# Patient Record
Sex: Female | Born: 1990
Health system: Southern US, Community
[De-identification: ages and names within clinical notes are randomized; demographics above are authoritative.]

## PROBLEM LIST (undated history)

## (undated) ENCOUNTER — Inpatient Hospital Stay: Payer: Self-pay

## (undated) DIAGNOSIS — D649 Anemia, unspecified: Secondary | ICD-10-CM

## (undated) DIAGNOSIS — R519 Headache, unspecified: Secondary | ICD-10-CM

## (undated) HISTORY — PX: NO PAST SURGERIES: SHX2092

## (undated) HISTORY — DX: Anemia, unspecified: D64.9

## (undated) HISTORY — DX: Headache, unspecified: R51.9

---
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
## 2012-03-28 ENCOUNTER — Emergency Department: Payer: Self-pay | Admitting: Emergency Medicine

## 2013-03-30 ENCOUNTER — Emergency Department: Payer: Self-pay | Admitting: Emergency Medicine

## 2013-03-30 LAB — URINALYSIS, COMPLETE
Blood: NEGATIVE
Glucose,UR: NEGATIVE mg/dL (ref 0–75)
Leukocyte Esterase: NEGATIVE
Nitrite: NEGATIVE
Ph: 5 (ref 4.5–8.0)
RBC,UR: <1 /HPF (ref 0–5)
Specific Gravity: 1.029 (ref 1.003–1.030)
WBC UR: 1 /HPF (ref 0–5)

## 2013-03-30 LAB — CBC
HCT: 37.5 % (ref 35.0–47.0)
HGB: 12.3 g/dL (ref 12.0–16.0)
MCHC: 32.8 g/dL (ref 32.0–36.0)
Platelet: 173 10*3/uL (ref 150–440)
RBC: 4.1 10*6/uL (ref 3.80–5.20)
RDW: 12.6 % (ref 11.5–14.5)

## 2013-03-30 LAB — PREGNANCY, URINE: Pregnancy Test, Urine: NEGATIVE m[IU]/mL

## 2013-03-30 LAB — COMPREHENSIVE METABOLIC PANEL
Albumin: 4.2 g/dL (ref 3.4–5.0)
Anion Gap: 5 — ABNORMAL LOW (ref 7–16)
BUN: 17 mg/dL (ref 7–18)
Bilirubin,Total: 0.6 mg/dL (ref 0.2–1.0)
Co2: 28 mmol/L (ref 21–32)
EGFR (African American): >60
EGFR (Non-African Amer.): >60
Glucose: 91 mg/dL (ref 65–99)
Potassium: 4 mmol/L (ref 3.5–5.1)
SGOT(AST): 24 U/L (ref 15–37)
Sodium: 138 mmol/L (ref 136–145)
Total Protein: 8.3 g/dL — ABNORMAL HIGH (ref 6.4–8.2)

## 2013-03-30 LAB — LIPASE, BLOOD: Lipase: 138 U/L (ref 73–393)

## 2014-01-09 ENCOUNTER — Emergency Department: Payer: Self-pay | Admitting: Emergency Medicine

## 2015-12-24 ENCOUNTER — Emergency Department
Admission: EM | Admit: 2015-12-24 | Discharge: 2015-12-24 | Disposition: A | Discharge status: Home / Self Care | Payer: 59 | Attending: Emergency Medicine | Admitting: Emergency Medicine

## 2015-12-24 ENCOUNTER — Encounter: Payer: Self-pay | Admitting: *Deleted

## 2015-12-24 DIAGNOSIS — R197 Diarrhea, unspecified: Secondary | ICD-10-CM

## 2015-12-24 DIAGNOSIS — A084 Viral intestinal infection, unspecified: Secondary | ICD-10-CM

## 2015-12-24 LAB — CBC WITH DIFFERENTIAL/PLATELET
BASOS ABS: 0 10*3/uL (ref 0–0.1)
Basophils Relative: 0 %
EOS ABS: 0 10*3/uL (ref 0–0.7)
EOS PCT: 0 %
HCT: 36.4 % (ref 35.0–47.0)
Hemoglobin: 12.1 g/dL (ref 12.0–16.0)
Lymphocytes Relative: 14 %
Lymphs Abs: 1.1 10*3/uL (ref 1.0–3.6)
MCH: 31.1 pg (ref 26.0–34.0)
MCHC: 33.3 g/dL (ref 32.0–36.0)
MCV: 93.3 fL (ref 80.0–100.0)
MONO ABS: 0.8 10*3/uL (ref 0.2–0.9)
Monocytes Relative: 11 %
Neutro Abs: 5.7 10*3/uL (ref 1.4–6.5)
Neutrophils Relative %: 75 %
PLATELETS: 137 10*3/uL — AB (ref 150–440)
RBC: 3.9 MIL/uL (ref 3.80–5.20)
RDW: 12.4 % (ref 11.5–14.5)
WBC: 7.6 10*3/uL (ref 3.6–11.0)

## 2015-12-24 LAB — INFLUENZA PANEL BY PCR (TYPE A & B)
H1N1 flu by pcr: NOT DETECTED
INFLAPCR: NEGATIVE
Influenza B By PCR: NEGATIVE

## 2015-12-24 LAB — COMPREHENSIVE METABOLIC PANEL
ALBUMIN: 4 g/dL (ref 3.5–5.0)
ALK PHOS: 34 U/L — AB (ref 38–126)
ALT: 15 U/L (ref 14–54)
ANION GAP: 7 (ref 5–15)
AST: 22 U/L (ref 15–41)
BILIRUBIN TOTAL: 0.4 mg/dL (ref 0.3–1.2)
BUN: 11 mg/dL (ref 6–20)
CALCIUM: 9 mg/dL (ref 8.9–10.3)
CO2: 27 mmol/L (ref 22–32)
Chloride: 101 mmol/L (ref 101–111)
Creatinine, Ser: 0.88 mg/dL (ref 0.44–1.00)
GFR calc Af Amer: >60 mL/min (ref >60)
GLUCOSE: 83 mg/dL (ref 65–99)
Potassium: 3.8 mmol/L (ref 3.5–5.1)
SODIUM: 135 mmol/L (ref 135–145)
TOTAL PROTEIN: 8.1 g/dL (ref 6.5–8.1)

## 2015-12-24 MED ORDER — LOPERAMIDE HCL 2 MG PO TABS: 2.0000 mg | ORAL_TABLET | Freq: Four times a day (QID) | ORAL | Status: DC | PRN

## 2015-12-24 MED ORDER — ONDANSETRON 4 MG PO TBDP: 4.0000 mg | ORAL_TABLET | Freq: Three times a day (TID) | ORAL | Status: DC | PRN

## 2015-12-24 MED ORDER — SODIUM CHLORIDE 0.9 % IV BOLUS (SEPSIS)
1000.0000 mL | Freq: Once | INTRAVENOUS | Status: AC
  Administered 2015-12-24: 1000 mL via INTRAVENOUS

## 2015-12-24 MED ORDER — ACETAMINOPHEN 500 MG PO TABS
1000.0000 mg | ORAL_TABLET | Freq: Once | ORAL | Status: AC
  Administered 2015-12-24: 1000 mg via ORAL
  Filled 2015-12-24: qty 2

## 2015-12-24 MED ORDER — ONDANSETRON HCL 4 MG/2ML IJ SOLN
4.0000 mg | Freq: Once | INTRAMUSCULAR | Status: AC
  Administered 2015-12-24: 4 mg via INTRAVENOUS
  Filled 2015-12-24: qty 2

## 2015-12-24 NOTE — Discharge Instructions (Signed)
Diarrhea Diarrhea is frequent loose and watery bowel movements. It can cause you to feel weak and dehydrated. Dehydration can cause you to become tired and thirsty, have a dry mouth, and have decreased urination that often is dark yellow. Diarrhea is a sign of another problem, most often an infection that will not last long. In most cases, diarrhea typically lasts 2-3 days. However, it can last longer if it is a sign of something more serious. It is important to treat your diarrhea as directed by your caregiver to lessen or prevent future episodes of diarrhea. CAUSES  Some common causes include:  Gastrointestinal infections caused by viruses, bacteria, or parasites.  Food poisoning or food allergies.  Certain medicines, such as antibiotics, chemotherapy, and laxatives.  Artificial sweeteners and fructose.  Digestive disorders. HOME CARE INSTRUCTIONS  Ensure adequate fluid intake (hydration): Have 1 cup (8 oz) of fluid for each diarrhea episode. Avoid fluids that contain simple sugars or sports drinks, fruit juices, whole milk products, and sodas. Your urine should be clear or pale yellow if you are drinking enough fluids. Hydrate with an oral rehydration solution that you can purchase at pharmacies, retail stores, and online. You can prepare an oral rehydration solution at home by mixing the following ingredients together:   - tsp table salt.   tsp baking soda.   tsp salt substitute containing potassium chloride.  1  tablespoons sugar.  1 L (34 oz) of water.  Certain foods and beverages may increase the speed at which food moves through the gastrointestinal (GI) tract. These foods and beverages should be avoided and include:  Caffeinated and alcoholic beverages.  High-fiber foods, such as raw fruits and vegetables, nuts, seeds, and whole grain breads and cereals.  Foods and beverages sweetened with sugar alcohols, such as xylitol, sorbitol, and mannitol.  Some foods may be well  tolerated and may help thicken stool including:  Starchy foods, such as rice, toast, pasta, low-sugar cereal, oatmeal, grits, baked potatoes, crackers, and bagels.  Bananas.  Applesauce.  Add probiotic-rich foods to help increase healthy bacteria in the GI tract, such as yogurt and fermented milk products.  Wash your hands well after each diarrhea episode.  Only take over-the-counter or prescription medicines as directed by your caregiver.  Take a warm bath to relieve any burning or pain from frequent diarrhea episodes. SEEK IMMEDIATE MEDICAL CARE IF:   You are unable to keep fluids down.  You have persistent vomiting.  You have blood in your stool, or your stools are black and tarry.  You do not urinate in 6-8 hours, or there is only a small amount of very dark urine.  You have abdominal pain that increases or localizes.  You have weakness, dizziness, confusion, or light-headedness.  You have a severe headache.  Your diarrhea gets worse or does not get better.  You have a fever or persistent symptoms for more than 2-3 days.  You have a fever and your symptoms suddenly get worse. MAKE SURE YOU:   Understand these instructions.  Will watch your condition.  Will get help right away if you are not doing well or get worse.   This information is not intended to replace advice given to you by your health care provider. Make sure you discuss any questions you have with your health care provider.   Document Released: 12/03/2002 Document Revised: 01/03/2015 Document Reviewed: 08/20/2012 Elsevier Interactive Patient Education 2016 [employer].  Food Choices to Help Relieve Diarrhea, Adult When you have diarrhea,  the foods you eat and your eating habits are very important. Choosing the right foods and drinks can help relieve diarrhea. Also, because diarrhea can last up to 7 days, you need to replace lost fluids and electrolytes (such as sodium, potassium, and chloride) in  order to help prevent dehydration.  °WHAT GENERAL GUIDELINES DO I NEED TO FOLLOW? °· Slowly drink 1 cup (8 oz) of fluid for each episode of diarrhea. If you are getting enough fluid, your urine will be clear or pale yellow. °· Eat starchy foods. Some good choices include white rice, white toast, pasta, low-fiber cereal, baked potatoes (without the skin), saltine crackers, and bagels. °· Avoid large servings of any cooked vegetables. °· Limit fruit to two servings per day. A serving is ½ cup or 1 small piece. °· Choose foods with less than 2 g of fiber per serving. °· Limit fats to less than 8 tsp (38 g) per day. °· Avoid fried foods. °· Eat foods that have probiotics in them. Probiotics can be found in certain dairy products. °· Avoid foods and beverages that may increase the speed at which food moves through the stomach and intestines (gastrointestinal tract). Things to avoid include: °· High-fiber foods, such as dried fruit, raw fruits and vegetables, nuts, seeds, and whole grain foods. °· Spicy foods and high-fat foods. °· Foods and beverages sweetened with high-fructose corn syrup, honey, or sugar alcohols such as xylitol, sorbitol, and mannitol. °WHAT FOODS ARE RECOMMENDED? °Grains °White rice. White, French, or pita breads (fresh or toasted), including plain rolls, buns, or bagels. White pasta. Saltine, soda, or graham crackers. Pretzels. Low-fiber cereal. Cooked cereals made with water (such as cornmeal, farina, or cream cereals). Plain muffins. Matzo. Melba toast. Zwieback.  °Vegetables °Potatoes (without the skin). Strained tomato and vegetable juices. Most well-cooked and canned vegetables without seeds. Tender lettuce. °Fruits °Cooked or canned applesauce, apricots, cherries, fruit cocktail, grapefruit, peaches, pears, or plums. Fresh bananas, apples without skin, cherries, grapes, cantaloupe, grapefruit, peaches, oranges, or plums.  °Meat and Other Protein Products °Baked or boiled chicken. Eggs. Tofu.  Fish. Seafood. Smooth peanut butter. Ground or well-cooked tender beef, ham, veal, lamb, pork, or poultry.  °Dairy °Plain yogurt, kefir, and unsweetened liquid yogurt. Lactose-free milk, buttermilk, or soy milk. Plain hard cheese. °Beverages °Sport drinks. Clear broths. Diluted fruit juices (except prune). Regular, caffeine-free sodas such as ginger ale. Water. Decaffeinated teas. Oral rehydration solutions. Sugar-free beverages not sweetened with sugar alcohols. °Other °Bouillon, broth, or soups made from recommended foods.  °The items listed above may not be a complete list of recommended foods or beverages. Contact your dietitian for more options. °WHAT FOODS ARE NOT RECOMMENDED? °Grains °Whole grain, whole wheat, bran, or rye breads, rolls, pastas, crackers, and cereals. Wild or brown rice. Cereals that contain more than 2 g of fiber per serving. Corn tortillas or taco shells. Cooked or dry oatmeal. Granola. Popcorn. °Vegetables °Raw vegetables. Cabbage, broccoli, Brussels sprouts, artichokes, baked beans, beet greens, corn, kale, legumes, peas, sweet potatoes, and yams. Potato skins. Cooked spinach and cabbage. °Fruits °Dried fruit, including raisins and dates. Raw fruits. Stewed or dried prunes. Fresh apples with skin, apricots, mangoes, pears, raspberries, and strawberries.  °Meat and Other Protein Products °Chunky peanut butter. Nuts and seeds. Beans and lentils. Bacon.  °Dairy °High-fat cheeses. Milk, chocolate milk, and beverages made with milk, such as milk shakes. Cream. Ice cream. °Sweets and Desserts °Sweet rolls, doughnuts, and sweet breads. Pancakes and waffles. °Fats and Oils °Butter. Cream sauces. Margarine.   Salad oils. Plain salad dressings. Olives. Avocados.  Beverages Caffeinated beverages (such as coffee, tea, soda, or energy drinks). Alcoholic beverages. Fruit juices with pulp. Prune juice. Soft drinks sweetened with high-fructose corn syrup or sugar alcohols. Other Coconut. Hot sauce.  Chili powder. Mayonnaise. Gravy. Cream-based or milk-based soups.  The items listed above may not be a complete list of foods and beverages to avoid. Contact your dietitian for more information. WHAT SHOULD I DO IF I BECOME DEHYDRATED? Diarrhea can sometimes lead to dehydration. Signs of dehydration include dark urine and dry mouth and skin. If you think you are dehydrated, you should rehydrate with an oral rehydration solution. These solutions can be purchased at pharmacies, retail stores, or online.  Drink -1 cup (120-240 mL) of oral rehydration solution each time you have an episode of diarrhea. If drinking this amount makes your diarrhea worse, try drinking smaller amounts more often. For example, drink 1-3 tsp (5-15 mL) every 5-10 minutes.  A general rule for staying hydrated is to drink 1-2 L of fluid per day. Talk to your health care provider about the specific amount you should be drinking each day. Drink enough fluids to keep your urine clear or pale yellow.   This information is not intended to replace advice given to you by your health care provider. Make sure you discuss any questions you have with your health care provider.   Document Released: 03/04/2004 Document Revised: 01/03/2015 Document Reviewed: 11/05/2013 Elsevier Interactive Patient Education 2016 Elsevier Inc.  Probiotics WHAT ARE PROBIOTICS? Probiotics are the good bacteria and yeasts that live in your body and keep you and your digestive system healthy. Probiotics also help your body's defense (immune) system and protect your body against bad bacterial growth.  Certain foods contain probiotics, such as yogurt. Probiotics can also be purchased as a supplement. As with any supplement or drug, it is important to discuss its use with your health care provider.  WHAT AFFECTS THE BALANCE OF BACTERIA IN MY BODY? The balance of bacteria in your body can be affected by:   Antibiotic medicines. Antibiotics are sometimes necessary  to treat infection. Unfortunately, they may kill good or friendly bacteria in your body as well as the bad bacteria. This may lead to stomach problems like diarrhea, gas, and cramping.  Disease. Some conditions are the result of an overgrowth of bad bacteria, yeasts, parasites, or fungi. These conditions include:   Infectious diarrhea.  Stomach and respiratory infections.  Skin infections.  Irritable bowel syndrome (IBS).  Inflammatory bowel diseases.  Ulcer due to Helicobacter pylori (H. pylori) infection.  Tooth decay and periodontal disease.  Vaginal infections. Stress and poor diet may also lower the good bacteria in your body.  WHAT TYPE OF PROBIOTIC IS RIGHT FOR ME? Probiotics are available over the counter at your local pharmacy, health food, or grocery store. They come in many different forms, combinations of strains, and dosing strengths. Some may need to be refrigerated. Always read the label for storage and usage instructions. Specific strains have been shown to be more effective for certain conditions. Ask your health care provider what option is best for you.  WHY WOULD I NEED PROBIOTICS? There are many reasons your health care provider might recommend a probiotic supplement, including:   Diarrhea.  Constipation.  IBS.  Respiratory infections.  Yeast infections.  Acne, eczema, and other skin conditions.  Frequent urinary tract infections (UTIs). ARE THERE SIDE EFFECTS OF PROBIOTICS? Some people experience mild side effects when taking probiotics.  Side effects are usually temporary and may include:   Gas.  Bloating.  Cramping. Rarely, serious side effects, such as infection or immune system changes, may occur. WHAT ELSE DO I NEED TO KNOW ABOUT PROBIOTICS?   There are many different strains of probiotics. Certain strains may be more effective depending on your condition. Probiotics are available in varying doses. Ask your health care provider which  probiotic you should use and how often.   If you are taking probiotics along with antibiotics, it is generally recommended to wait at least 2 hours between taking the antibiotic and taking the probiotic.  FOR MORE INFORMATION:  [facility] http://potts.com/http://nccam.nih.gov/   This information is not intended to replace advice given to you by your health care provider. Make sure you discuss any questions you have with your health care provider.   Document Released: 07/10/2014 Document Reviewed: 07/10/2014 Elsevier Interactive Patient Education 2016 [employer]  Viral Gastroenteritis Viral gastroenteritis is also known as stomach flu. This condition affects the stomach and intestinal tract. It can cause sudden diarrhea and vomiting. The illness typically lasts 3 to 8 days. Most people develop an immune response that eventually gets rid of the virus. While this natural response develops, the virus can make you quite ill. CAUSES  Many different viruses can cause gastroenteritis, such as rotavirus or noroviruses. You can catch one of these viruses by consuming contaminated food or water. You may also catch a virus by sharing utensils or other personal items with an infected person or by touching a contaminated surface. SYMPTOMS  The most common symptoms are diarrhea and vomiting. These problems can cause a severe loss of body fluids (dehydration) and a body salt (electrolyte) imbalance. Other symptoms may include:  Fever.  Headache.  Fatigue.  Abdominal pain. DIAGNOSIS  Your caregiver can usually diagnose viral gastroenteritis based on your symptoms and a physical exam. A stool sample may also be taken to test for the presence of viruses or other infections. TREATMENT  This illness typically goes away on its own. Treatments are aimed at rehydration. The most serious cases of viral gastroenteritis involve vomiting so severely that you are not able to keep  fluids down. In these cases, fluids must be given through an intravenous line (IV). HOME CARE INSTRUCTIONS   Drink enough fluids to keep your urine clear or pale yellow. Drink small amounts of fluids frequently and increase the amounts as tolerated.  Ask your caregiver for specific rehydration instructions.  Avoid:  Foods high in sugar.  Alcohol.  Carbonated drinks.  Tobacco.  Juice.  Caffeine drinks.  Extremely hot or cold fluids.  Fatty, greasy foods.  Too much intake of anything at one time.  Dairy products until 24 to 48 hours after diarrhea stops.  You may consume probiotics. Probiotics are active cultures of beneficial bacteria. They may lessen the amount and number of diarrheal stools in adults. Probiotics can be found in yogurt with active cultures and in supplements.  Wash your hands well to avoid spreading the virus.  Only take over-the-counter or prescription medicines for pain, discomfort, or fever as directed by your caregiver. Do not give aspirin to children. Antidiarrheal medicines are not recommended.  Ask your caregiver if you should continue to take your regular prescribed and over-the-counter medicines.  Keep all follow-up appointments as directed by your caregiver. SEEK IMMEDIATE MEDICAL CARE IF:   You are unable to keep fluids down.  You do not urinate at least once  every 6 to 8 hours.  You develop shortness of breath.  You notice blood in your stool or vomit. This may look like coffee grounds.  You have abdominal pain that increases or is concentrated in one small area (localized).  You have persistent vomiting or diarrhea.  You have a fever.  The patient is a child younger than 3 months, and he or she has a fever.  The patient is a child older than 3 months, and he or she has a fever and persistent symptoms.  The patient is a child older than 3 months, and he or she has a fever and symptoms suddenly get worse.  The patient is a baby,  and he or she has no tears when crying. MAKE SURE YOU:   Understand these instructions.  Will watch your condition.  Will get help right away if you are not doing well or get worse.   This information is not intended to replace advice given to you by your health care provider. Make sure you discuss any questions you have with your health care provider.   Document Released: 12/13/2005 Document Revised: 03/06/2012 Document Reviewed: 09/29/2011 Elsevier Interactive Patient Education [employer]

## 2015-12-24 NOTE — ED Notes (Signed)
Body aches with some nausea and diarrhea for couple of days  Positive fever last diarrhea stool was about 3 pm

## 2015-12-24 NOTE — ED Provider Notes (Signed)
[facility] Emergency Department Provider Note  ____________________________________________  Time seen: Approximately 5:53 PM  I have reviewed the triage vital signs and the nursing notes.   HISTORY  Chief Complaint Generalized Body Aches    HPI Michelle Neal is a 24 y.o. female who presents emergency department complaining of generalized body aches, nausea, diarrhea 2 days. Per the patient symptoms began rapidly 2 days prior and included fever in addition to above listed symptoms. Patient states that she initially thought it was a GI bug that would past but states that symptoms have only worsened. She denies any blood in her stools. She denies any abdominal pain. She endorses generalized muscle and joint aches. Patient denies any recent travel, change in foods or medications, recent sick contacts.Patient denies any URI symptoms.   History reviewed. No pertinent past medical history.  There are no active problems to display for this patient.   History reviewed. No pertinent past surgical history.  Current Outpatient Rx  Name  Route  Sig  Dispense  Refill  . loperamide (IMODIUM A-D) 2 MG tablet   Oral   Take 1 tablet (2 mg total) by mouth 4 (four) times daily as needed for diarrhea or loose stools.   30 tablet   0   . ondansetron (ZOFRAN-ODT) 4 MG disintegrating tablet   Oral   Take 1 tablet (4 mg total) by mouth every 8 (eight) hours as needed for nausea or vomiting.   20 tablet   0     Allergies Review of patient's allergies indicates no known allergies.  No family history on file.  Social History Social History  Substance Use Topics  . Smoking status: Never Smoker   . Smokeless tobacco: None  . Alcohol Use: No    Review of Systems Constitutional: Endorses fever/chills Eyes: No visual changes. ENT: No sore throat. Cardiovascular: Denies chest pain. Respiratory: Denies shortness of breath. Gastrointestinal: No abdominal pain.  No  nausea, no vomiting.  Endorses diarrhea.  No constipation. Genitourinary: Negative for dysuria. Musculoskeletal: Negative for back pain. Generalized muscle aches. Skin: Negative for rash. Neurological: Negative for headaches, focal weakness or numbness.  10-point ROS otherwise negative.  ____________________________________________   PHYSICAL EXAM:  VITAL SIGNS: ED Triage Vitals  Enc Vitals Group     BP 12/24/15 1714 112/75 mmHg     Pulse Rate 12/24/15 1714 119     Resp 12/24/15 1714 18     Temp 12/24/15 1714 101.6 F (38.7 C)     Temp Source 12/24/15 1714 Oral     SpO2 12/24/15 1714 100 %     Weight 12/24/15 1714 142 lb (64.411 kg)     Height 12/24/15 1714 5\' 4"  (1.626 m)     Head Cir --      Peak Flow --      Pain Score 12/24/15 1715 8     Pain Loc --      Pain Edu? --      Excl. in GC? --     Constitutional: Alert and oriented. Well appearing and in no acute distress. Eyes: Conjunctivae are normal. PERRL. EOMI. Head: Atraumatic. Nose: No congestion/rhinnorhea. Mouth/Throat: Mucous membranes are moist.  Oropharynx non-erythematous. Neck: No stridor.   Hematological/Lymphatic/Immunilogical: No cervical lymphadenopathy. Cardiovascular: Normal rate, regular rhythm. Grossly normal heart sounds.  Good peripheral circulation. Respiratory: Normal respiratory effort.  No retractions. Lungs CTAB. Gastrointestinal: Soft and nontender to palpation all 4 quadrants. Bowel sounds present 4 quadrants. No guarding. No rigidity.. No distention.  No abdominal bruits. No CVA tenderness. Musculoskeletal: No lower extremity tenderness nor edema.  No joint effusions. Neurologic:  Normal speech and language. No gross focal neurologic deficits are appreciated. No gait instability. Skin:  Skin is warm, dry and intact. No rash noted. Psychiatric: Mood and affect are normal. Speech and behavior are normal.  ____________________________________________   LABS (all labs ordered are listed,  but only abnormal results are displayed)  Labs Reviewed  CBC WITH DIFFERENTIAL/PLATELET - Abnormal; Notable for the following:    Platelets 137 (*)    All other components within normal limits  COMPREHENSIVE METABOLIC PANEL - Abnormal; Notable for the following:    Alkaline Phosphatase 34 (*)    All other components within normal limits  RAPID INFLUENZA A&B ANTIGENS ([facility] ONLY)  INFLUENZA PANEL BY PCR (TYPE A & B, H1N1)   ____________________________________________  EKG   ____________________________________________  RADIOLOGY   ____________________________________________   PROCEDURES  Procedure(s) performed: None  Critical Care performed: No  ____________________________________________   INITIAL IMPRESSION / ASSESSMENT AND PLAN / ED COURSE  Pertinent labs & imaging results that were available during my care of the patient were reviewed by me and considered in my medical decision making (see chart for details).  Patient presents emergency department complaining of relatively quick onset of fever, chills, body aches, diarrhea.. Exam was relatively unremarkable with no acute findings. Labs were ordered and resulted as above. Patient did not endorse any abdominal pain and exam reveals no tenderness to palpation. No abdominal imaging is ordered at this time. Laboratory results returned with no acute abnormality. Patient is diagnosed with viral gastroenteritis and diarrhea. Patient is given prescriptions for ondansetron and appear mild. She is instructed to use probiotics before using the loperamide for diarrhea. Patient is also take Tylenol and ibuprofen at home for additional symptom control. Patient will follow-up with primary care for any symptoms persisting past this treatment course. ____________________________________________   FINAL CLINICAL IMPRESSION(S) / ED DIAGNOSES  Final diagnoses:  Viral gastroenteritis  Diarrhea, unspecified type      Racheal Patches, PA-C 12/24/15 2217  Loleta Rose, MD 12/25/15 1610

## 2015-12-24 NOTE — ED Notes (Signed)
Pt reports body aches, nausea, diarrhea and headache for the last 2 days

## 2016-08-18 ENCOUNTER — Emergency Department
Admission: EM | Admit: 2016-08-18 | Discharge: 2016-08-18 | Disposition: A | Discharge status: Home / Self Care | Payer: 59 | Attending: Emergency Medicine | Admitting: Emergency Medicine

## 2016-08-18 ENCOUNTER — Emergency Department: Payer: 59

## 2016-08-18 ENCOUNTER — Encounter: Payer: Self-pay | Admitting: *Deleted

## 2016-08-18 DIAGNOSIS — Z3A01 Less than 8 weeks gestation of pregnancy: Secondary | ICD-10-CM | POA: Insufficient documentation

## 2016-08-18 DIAGNOSIS — O039 Complete or unspecified spontaneous abortion without complication: Secondary | ICD-10-CM

## 2016-08-18 DIAGNOSIS — R102 Pelvic and perineal pain: Secondary | ICD-10-CM | POA: Diagnosis not present

## 2016-08-18 DIAGNOSIS — O046 Delayed or excessive hemorrhage following (induced) termination of pregnancy: Secondary | ICD-10-CM | POA: Insufficient documentation

## 2016-08-18 DIAGNOSIS — O2341 Unspecified infection of urinary tract in pregnancy, first trimester: Secondary | ICD-10-CM | POA: Insufficient documentation

## 2016-08-18 DIAGNOSIS — N390 Urinary tract infection, site not specified: Secondary | ICD-10-CM

## 2016-08-18 DIAGNOSIS — O209 Hemorrhage in early pregnancy, unspecified: Secondary | ICD-10-CM | POA: Diagnosis present

## 2016-08-18 DIAGNOSIS — N939 Abnormal uterine and vaginal bleeding, unspecified: Secondary | ICD-10-CM

## 2016-08-18 LAB — CBC
HCT: 29.3 % — ABNORMAL LOW (ref 35.0–47.0)
Hemoglobin: 10 g/dL — ABNORMAL LOW (ref 12.0–16.0)
MCH: 32.1 pg (ref 26.0–34.0)
MCHC: 34 g/dL (ref 32.0–36.0)
MCV: 94.5 fL (ref 80.0–100.0)
PLATELETS: 131 10*3/uL — AB (ref 150–440)
RBC: 3.1 MIL/uL — ABNORMAL LOW (ref 3.80–5.20)
RDW: 12.6 % (ref 11.5–14.5)
WBC: 9.4 10*3/uL (ref 3.6–11.0)

## 2016-08-18 LAB — HCG, QUANTITATIVE, PREGNANCY: HCG, BETA CHAIN, QUANT, S: 28931 m[IU]/mL — AB (ref <5)

## 2016-08-18 LAB — URINALYSIS COMPLETE WITH MICROSCOPIC (ARMC ONLY)
BILIRUBIN URINE: NEGATIVE
Glucose, UA: NEGATIVE mg/dL
KETONES UR: NEGATIVE mg/dL
Nitrite: NEGATIVE
PROTEIN: 100 mg/dL — AB
Specific Gravity, Urine: 1.032 — ABNORMAL HIGH (ref 1.005–1.030)
pH: 5 (ref 5.0–8.0)

## 2016-08-18 LAB — POC URINE PREG, ED: PREG TEST UR: POSITIVE — AB

## 2016-08-18 MED ORDER — NITROFURANTOIN MACROCRYSTAL 100 MG PO CAPS: 100.0000 mg | ORAL_CAPSULE | Freq: Two times a day (BID) | ORAL | 0 refills | Status: DC

## 2016-08-18 NOTE — ED Triage Notes (Signed)
States she had a pregnancy terminated on Friday and now states continued abd pain, feeling hot, states vaginal bleeding and clots, pt awake and alert in no acute distress

## 2016-08-18 NOTE — ED Provider Notes (Signed)
[facility] Emergency Department Provider Note  ____________________________________________  Time seen: Approximately 5:02 PM  I have reviewed the triage vital signs and the nursing notes.   HISTORY  Chief Complaint Abdominal Pain    HPI Michelle Neal is a 25 y.o. female who complains of pelvic cramping and vaginal bleeding and worsening for the past 3 days. She reports that she is about [redacted] weeks pregnant, and went to a family [place] in [place] 5 or 6 days ago for a termination of pregnancy. She was given Cytotec, and has been having cramping since then. Over the last few days she is having frequent bleeding, changing a pad an hour, and reports some dizziness and fatigue. She gets very tired when she walks around. She is still bleeding and has not noticed any passage of tissue, but there have been lots of blood clots. No discharge. No fevers or chills. No syncope. No chest pain.  History reviewed. No pertinent past medical history.   There are no active problems to display for this patient.    History reviewed. No pertinent surgical history.   Prior to Admission medications   Medication Sig Start Date End Date Taking? Authorizing Provider  loperamide (IMODIUM A-D) 2 MG tablet Take 1 tablet (2 mg total) by mouth 4 (four) times daily as needed for diarrhea or loose stools. 12/24/15   Delorise RoyalsJonathan D Cuthriell, PA-C  ondansetron (ZOFRAN-ODT) 4 MG disintegrating tablet Take 1 tablet (4 mg total) by mouth every 8 (eight) hours as needed for nausea or vomiting. 12/24/15   Delorise RoyalsJonathan D Cuthriell, PA-C     Allergies Review of patient's allergies indicates no known allergies.   History reviewed. No pertinent family history.  Social History Social History  Substance Use Topics  . Smoking status: Never Smoker  . Smokeless tobacco: Not on file  . Alcohol use No    Review of Systems  Constitutional:   No fever or chills.  . Cardiovascular:   No chest  pain. Respiratory:   No dyspnea or cough. Gastrointestinal:   Positive pelvic cramping.  Genitourinary:   Negative for dysuria or difficulty urinating. Positive vaginal bleeding. No discharge Musculoskeletal:   Negative for focal pain or swelling  10-point ROS otherwise negative.  ____________________________________________   PHYSICAL EXAM:  VITAL SIGNS: ED Triage Vitals [08/18/16 1515]  Enc Vitals Group     BP 108/60     Pulse Rate (!) 103     Resp 18     Temp 98.9 F (37.2 C)     Temp Source Oral     SpO2 99 %     Weight 145 lb (65.8 kg)     Height 5\' 4"  (1.626 m)     Head Circumference      Peak Flow      Pain Score 5     Pain Loc      Pain Edu?      Excl. in GC?     Vital signs reviewed, nursing assessments reviewed.   Constitutional:   Alert and oriented. Well appearing and in no distress. Eyes:   No scleral icterus. No conjunctival pallor. PERRL. EOMI.  No nystagmus. ENT   Head:   Normocephalic and atraumatic.   Nose:   No congestion/rhinnorhea. No septal hematoma   Mouth/Throat:   MMM, no pharyngeal erythema. No peritonsillar mass.    Neck:   No stridor. No SubQ emphysema. No meningismus. Hematological/Lymphatic/Immunilogical:   No cervical lymphadenopathy. Cardiovascular:   RRR. Symmetric bilateral  radial and DP pulses.  No murmurs.  Respiratory:   Normal respiratory effort without tachypnea nor retractions. Breath sounds are clear and equal bilaterally. No wheezes/rales/rhonchi. Gastrointestinal:   Soft With suprapubic tenderness over a gravid uterus. Size consistent with dates.. Non distended. There is no CVA tenderness.  No rebound, rigidity, or guarding. Genitourinary:   deferred Musculoskeletal:   Nontender with normal range of motion in all extremities. No joint effusions.  No lower extremity tenderness.  No edema. Neurologic:   Normal speech and language.  CN 2-10 normal. Motor grossly intact. No gross focal neurologic deficits are  appreciated.  Skin:    Skin is warm, dry and intact. No rash noted.  No petechiae, purpura, or bullae.  ____________________________________________    LABS (pertinent positives/negatives) (all labs ordered are listed, but only abnormal results are displayed) Labs Reviewed  CBC - Abnormal; Notable for the following:       Result Value   RBC 3.10 (*)    Hemoglobin 10.0 (*)    HCT 29.3 (*)    Platelets 131 (*)    All other components within normal limits  HCG, QUANTITATIVE, PREGNANCY - Abnormal; Notable for the following:    hCG, Beta Chain, Quant, S 28,931 (*)    All other components within normal limits  URINALYSIS COMPLETEWITH MICROSCOPIC ([facility] ONLY) - Abnormal; Notable for the following:    Color, Urine YELLOW (*)    APPearance HAZY (*)    Specific Gravity, Urine 1.032 (*)    Hgb urine dipstick 3+ (*)    Protein, ur 100 (*)    Leukocytes, UA 2+ (*)    Bacteria, UA RARE (*)    Squamous Epithelial / LPF 0-5 (*)    All other components within normal limits  POC URINE PREG, ED - Abnormal; Notable for the following:    Preg Test, Ur Positive (*)    All other components within normal limits   ____________________________________________   EKG    ____________________________________________    RADIOLOGY Ultrasound pelvis Endometrial cavity distended with blood products, less likely products of conception without discrete gestational sac or IUP. Recommend serial HCG and, follow-up ultrasound as clinically indicated.  ____________________________________________   PROCEDURES Procedures  ____________________________________________   INITIAL IMPRESSION / ASSESSMENT AND PLAN / ED COURSE  Pertinent labs & imaging results that were available during my care of the patient were reviewed by me and considered in my medical decision making (see chart for details).  Patient well appearing no acute distress. Presents with vaginal bleeding and cramping after misoprostol.  This could be the expected course for pharmacologically-induced termination of pregnancy, or possible abortion with retained products of conception. She reports undergoing an ultrasound in the clinic, so presumably she does not have an ectopic. We'll obtain an ultrasound in the ED today to evaluate. Hemoglobin has decreased from a baseline of 12 in the past to 10, and this may be an acute change over the past few days which could be causing her symptoms of fatigue. Vital signs are unremarkable, she is hemodynamically stable currently.   Clinical Course    ----------------------------------------- 7:34 PM on 08/18/2016 -----------------------------------------  Ultrasound read shows blood in the endometrium but no evidence of retained products of conception. No IUP or ectopic pregnancy identified. Patient remains well appearing not in distress. Vital signs stable. Symptoms controlled. Ambulatory without symptoms. No evidence of significant anemia. As she has just had a termination, this is within the range of expected symptoms and clinical course for this process.  Encouraged her to follow up with gynecology in one day, or return to ED if bleeding does not abate within 24 hours. Return precautions given. ____________________________________________   FINAL CLINICAL IMPRESSION(S) / ED DIAGNOSES  Final diagnoses:  Vaginal bleeding  Completed inevitable abortion       Portions of this note were generated with dragon dictation software. Dictation errors may occur despite best attempts at proofreading.    Sharman Cheek, MD 08/18/16 [phone]

## 2016-11-03 DIAGNOSIS — D649 Anemia, unspecified: Secondary | ICD-10-CM | POA: Insufficient documentation

## 2016-11-03 DIAGNOSIS — R768 Other specified abnormal immunological findings in serum: Secondary | ICD-10-CM | POA: Insufficient documentation

## 2016-12-27 NOTE — L&D Delivery Note (Signed)
Delivery Note At 10:27 AM a viable and healthy female was delivered via Vaginal, Spontaneous Delivery (Presentation: LOA ;  ).  APGAR: 8, 9; weight 7 lb 1.8 oz (3225 g).   Placenta status: intact ,spontaneous .  Cord: 3 vessel with the following complications: none.    Anesthesia:  epidural Episiotomy: None Lacerations: 1st degree Suture Repair: hemostatic, not repaired Est. Blood Loss (mL): 100  Mom to postpartum.  Baby to Couplet care / Skin to Skin.  Michelle Neal is a 26 y.o. female [phone] with IUP at 3959w0d admitted for PROM.  She progressed with augmentation to complete and pushed less than 10 minutes to deliver.  Nuchal cord at delivery, unable to reduce so delivered through, was loose but around neck x 1, body x 1, and leg x 1.  Infant vigorous immediately at delivery and placed on maternal abdomen.  Cord clamping delayed by 1-3 minutes then clamped by CNM and cut by FOB.  Placenta intact and spontaneous, bleeding minimal.  First degree perineal laceration hemostatic, not repaired.  Mom and baby stable prior to transfer to postpartum. She plans on breastfeeding.    Michelle Neal 07/08/2017, 12:08 PM

## 2016-12-29 ENCOUNTER — Ambulatory Visit (INDEPENDENT_AMBULATORY_CARE_PROVIDER_SITE_OTHER): Payer: 59 | Admitting: Obstetrics & Gynecology

## 2016-12-29 ENCOUNTER — Encounter: Payer: Self-pay | Admitting: Obstetrics & Gynecology

## 2016-12-29 DIAGNOSIS — Z3689 Encounter for other specified antenatal screening: Secondary | ICD-10-CM

## 2016-12-29 DIAGNOSIS — O99019 Anemia complicating pregnancy, unspecified trimester: Secondary | ICD-10-CM | POA: Insufficient documentation

## 2016-12-29 DIAGNOSIS — Z23 Encounter for immunization: Secondary | ICD-10-CM | POA: Diagnosis not present

## 2016-12-29 DIAGNOSIS — Z3490 Encounter for supervision of normal pregnancy, unspecified, unspecified trimester: Secondary | ICD-10-CM | POA: Insufficient documentation

## 2016-12-29 DIAGNOSIS — D696 Thrombocytopenia, unspecified: Secondary | ICD-10-CM | POA: Insufficient documentation

## 2016-12-29 DIAGNOSIS — Z3491 Encounter for supervision of normal pregnancy, unspecified, first trimester: Secondary | ICD-10-CM

## 2016-12-29 DIAGNOSIS — O99119 Other diseases of the blood and blood-forming organs and certain disorders involving the immune mechanism complicating pregnancy, unspecified trimester: Secondary | ICD-10-CM | POA: Insufficient documentation

## 2016-12-29 NOTE — Progress Notes (Signed)
Bedside US shows single IUP measuring 5869w5d by CRL and FHR 160's.  This is not a planned pregnancy but pt states she is becoming more happy about it. Last pap in 2015 but pt is unsure of exact date

## 2016-12-31 LAB — PRENATAL PROFILE I(LABCORP)
Antibody Screen: NEGATIVE
Basophils Absolute: 0 10*3/uL (ref 0.0–0.2)
Basos: 0 %
EOS (ABSOLUTE): 0 10*3/uL (ref 0.0–0.4)
Eos: 1 %
HEMATOCRIT: 33.5 % — AB (ref 34.0–46.6)
Hemoglobin: 10.8 g/dL — ABNORMAL LOW (ref 11.1–15.9)
Hepatitis B Surface Ag: NEGATIVE
Immature Grans (Abs): 0 10*3/uL (ref 0.0–0.1)
Immature Granulocytes: 0 %
Lymphocytes Absolute: 1.5 10*3/uL (ref 0.7–3.1)
Lymphs: 42 %
MCH: 28.3 pg (ref 26.6–33.0)
MCHC: 32.2 g/dL (ref 31.5–35.7)
MCV: 88 fL (ref 79–97)
MONOCYTES: 11 %
Monocytes Absolute: 0.4 10*3/uL (ref 0.1–0.9)
NEUTROS ABS: 1.7 10*3/uL (ref 1.4–7.0)
Neutrophils: 46 %
Platelets: 166 10*3/uL (ref 150–379)
RBC: 3.81 x10E6/uL (ref 3.77–5.28)
RDW: 17.2 % — AB (ref 12.3–15.4)
RPR Ser Ql: NONREACTIVE
RUBELLA: 4.9 {index} (ref >0.99)
Rh Factor: POSITIVE
WBC: 3.7 10*3/uL (ref 3.4–10.8)

## 2016-12-31 LAB — HEMOGLOBINOPATHY EVALUATION
HEMOGLOBIN A2 QUANTITATION: 2.8 % (ref 1.8–3.2)
HGB C: 0 %
HGB S: 0 %
HGB VARIANT: 0 %
Hemoglobin F Quantitation: 0 % (ref 0.0–2.0)
Hgb A: 97.2 % (ref 96.4–98.8)

## 2016-12-31 LAB — HIV ANTIBODY (ROUTINE TESTING W REFLEX): HIV Screen 4th Generation wRfx: NONREACTIVE

## 2016-12-31 LAB — CULTURE, OB URINE

## 2016-12-31 LAB — TSH: TSH: 2.81 u[IU]/mL (ref 0.450–4.500)

## 2016-12-31 LAB — VITAMIN D 25 HYDROXY (VIT D DEFICIENCY, FRACTURES): Vit D, 25-Hydroxy: 42.4 ng/mL (ref 30.0–100.0)

## 2017-01-03 LAB — CYTOLOGY - PAP
Chlamydia: NEGATIVE
Neisseria Gonorrhea: NEGATIVE

## 2017-01-04 ENCOUNTER — Encounter: Payer: Self-pay | Admitting: *Deleted

## 2017-01-04 DIAGNOSIS — Z3490 Encounter for supervision of normal pregnancy, unspecified, unspecified trimester: Secondary | ICD-10-CM

## 2017-01-04 LAB — TOXASSURE SELECT 13 (MW), URINE

## 2017-01-06 ENCOUNTER — Encounter: Payer: Self-pay | Admitting: Obstetrics & Gynecology

## 2017-01-06 DIAGNOSIS — R87612 Low grade squamous intraepithelial lesion on cytologic smear of cervix (LGSIL): Secondary | ICD-10-CM | POA: Insufficient documentation

## 2017-01-26 ENCOUNTER — Encounter: Payer: Self-pay | Admitting: *Deleted

## 2017-02-11 ENCOUNTER — Encounter (HOSPITAL_COMMUNITY): Payer: Self-pay | Admitting: Obstetrics & Gynecology

## 2017-02-16 ENCOUNTER — Ambulatory Visit (HOSPITAL_COMMUNITY)
Admission: RE | Admit: 2017-02-16 | Discharge: 2017-02-16 | Disposition: A | Discharge status: Home / Self Care | Payer: 59 | Source: Ambulatory Visit | Attending: Obstetrics & Gynecology | Admitting: Obstetrics & Gynecology

## 2017-02-16 ENCOUNTER — Other Ambulatory Visit: Payer: Self-pay | Admitting: Obstetrics & Gynecology

## 2017-02-16 DIAGNOSIS — Z363 Encounter for antenatal screening for malformations: Secondary | ICD-10-CM | POA: Diagnosis not present

## 2017-02-16 DIAGNOSIS — Z3689 Encounter for other specified antenatal screening: Secondary | ICD-10-CM

## 2017-02-16 DIAGNOSIS — Z3A18 18 weeks gestation of pregnancy: Secondary | ICD-10-CM

## 2017-02-16 DIAGNOSIS — Z3490 Encounter for supervision of normal pregnancy, unspecified, unspecified trimester: Secondary | ICD-10-CM

## 2017-02-23 ENCOUNTER — Ambulatory Visit (INDEPENDENT_AMBULATORY_CARE_PROVIDER_SITE_OTHER): Payer: 59 | Admitting: Obstetrics and Gynecology

## 2017-02-23 VITALS — BP 102/65 | HR 84 | Wt 165.0 lb

## 2017-02-23 DIAGNOSIS — O99119 Other diseases of the blood and blood-forming organs and certain disorders involving the immune mechanism complicating pregnancy, unspecified trimester: Secondary | ICD-10-CM

## 2017-02-23 DIAGNOSIS — O99112 Other diseases of the blood and blood-forming organs and certain disorders involving the immune mechanism complicating pregnancy, second trimester: Secondary | ICD-10-CM

## 2017-02-23 DIAGNOSIS — D649 Anemia, unspecified: Secondary | ICD-10-CM

## 2017-02-23 DIAGNOSIS — Z3492 Encounter for supervision of normal pregnancy, unspecified, second trimester: Secondary | ICD-10-CM

## 2017-02-23 DIAGNOSIS — Z3490 Encounter for supervision of normal pregnancy, unspecified, unspecified trimester: Secondary | ICD-10-CM

## 2017-02-23 DIAGNOSIS — D696 Thrombocytopenia, unspecified: Secondary | ICD-10-CM

## 2017-02-23 DIAGNOSIS — O99012 Anemia complicating pregnancy, second trimester: Secondary | ICD-10-CM

## 2017-02-23 NOTE — Progress Notes (Signed)
Prenatal Visit Note Date: 02/23/2017 Clinic: [facility]  Subjective:  Michelle Neal is a 26 y.o. G3P1011 at 5670w5d being seen today for ongoing prenatal care.  She is currently monitored for the following issues for this low-risk pregnancy and has Supervision of normal pregnancy, antepartum; Anemia in pregnancy; Thrombocytopenia affecting pregnancy (HCC); and LGSIL on Pap smear of cervix on her problem list.  Patient reports no complaints.   Contractions: Not present. Vag. Bleeding: None.  Movement: Present. Denies leaking of fluid.   The following portions of the patient's history were reviewed and updated as appropriate: allergies, current medications, past family history, past medical history, past social history, past surgical history and problem list. Problem list updated.  Objective:   Vitals:   02/23/17 0946  BP: 102/65  Pulse: 84  Weight: 165 lb (74.8 kg)    Fetal Status: Fetal Heart Rate (bpm): 152   Movement: Present     General:  Alert, oriented and cooperative. Patient is in no acute distress.  Skin: Skin is warm and dry. No rash noted.   Cardiovascular: Normal heart rate noted  Respiratory: Normal respiratory effort, no problems with respiration noted  Abdomen: Soft, gravid, appropriate for gestational age. Pain/Pressure: Absent     Pelvic:  Cervical exam deferred        Extremities: Normal range of motion.  Edema: None  Mental Status: Normal mood and affect. Normal behavior. Normal judgment and thought content.   Urinalysis:      Assessment and Plan:  Pregnancy: G3P1011 at 2270w5d  1. Encounter for supervision of normal pregnancy, antepartum, unspecified gravidity Routine care. F/u anatomy scan in a few weeks - US MFM OB FOLLOW UP; Future  2. Thrombocytopenia affecting pregnancy (HCC) Check with 28wk labs  3. Anemia during pregnancy in second trimester F/u cbc at 28wk labs. Continue daily iron  4. LSIL pap colpo now vs 6wks pp. Will  defer management to ordering.    Preterm labor symptoms and general obstetric precautions including but not limited to vaginal bleeding, contractions, leaking of fluid and fetal movement were reviewed in detail with the patient. Please refer to After Visit Summary for other counseling recommendations.  Return for per baby scripts. 2hr GTT nv. .   Walcott Bingharlie Syan Cullimore, MD

## 2017-03-16 ENCOUNTER — Ambulatory Visit (HOSPITAL_COMMUNITY)
Admission: RE | Admit: 2017-03-16 | Discharge: 2017-03-16 | Disposition: A | Discharge status: Home / Self Care | Payer: 59 | Source: Ambulatory Visit | Attending: Obstetrics and Gynecology | Admitting: Obstetrics and Gynecology

## 2017-03-16 DIAGNOSIS — Z3490 Encounter for supervision of normal pregnancy, unspecified, unspecified trimester: Secondary | ICD-10-CM

## 2017-03-16 DIAGNOSIS — Z362 Encounter for other antenatal screening follow-up: Secondary | ICD-10-CM | POA: Insufficient documentation

## 2017-03-16 DIAGNOSIS — Z3A22 22 weeks gestation of pregnancy: Secondary | ICD-10-CM | POA: Diagnosis not present

## 2017-04-20 ENCOUNTER — Ambulatory Visit (INDEPENDENT_AMBULATORY_CARE_PROVIDER_SITE_OTHER): Payer: 59 | Admitting: Obstetrics & Gynecology

## 2017-04-20 VITALS — BP 95/64 | HR 93 | Wt 173.0 lb

## 2017-04-20 DIAGNOSIS — O26893 Other specified pregnancy related conditions, third trimester: Secondary | ICD-10-CM

## 2017-04-20 DIAGNOSIS — O26892 Other specified pregnancy related conditions, second trimester: Secondary | ICD-10-CM

## 2017-04-20 DIAGNOSIS — O99119 Other diseases of the blood and blood-forming organs and certain disorders involving the immune mechanism complicating pregnancy, unspecified trimester: Secondary | ICD-10-CM

## 2017-04-20 DIAGNOSIS — O99112 Other diseases of the blood and blood-forming organs and certain disorders involving the immune mechanism complicating pregnancy, second trimester: Secondary | ICD-10-CM

## 2017-04-20 DIAGNOSIS — D696 Thrombocytopenia, unspecified: Secondary | ICD-10-CM

## 2017-04-20 DIAGNOSIS — Z3482 Encounter for supervision of other normal pregnancy, second trimester: Secondary | ICD-10-CM | POA: Diagnosis not present

## 2017-04-20 DIAGNOSIS — Z3490 Encounter for supervision of normal pregnancy, unspecified, unspecified trimester: Secondary | ICD-10-CM

## 2017-04-20 DIAGNOSIS — Z23 Encounter for immunization: Secondary | ICD-10-CM

## 2017-04-20 DIAGNOSIS — R51 Headache: Secondary | ICD-10-CM

## 2017-04-20 MED ORDER — BUTALBITAL-APAP-CAFFEINE 50-325-40 MG PO CAPS: 1.0000 | ORAL_CAPSULE | Freq: Four times a day (QID) | ORAL | 3 refills | Status: DC | PRN

## 2017-04-20 NOTE — Progress Notes (Signed)
28wk labs today with TDAP Needs something called in for headaches and set up appt with Nada Maclachlan, PA

## 2017-04-20 NOTE — Patient Instructions (Signed)
Third Trimester of Pregnancy The third trimester is from week 28 through week 40 (months 7 through 9). The third trimester is a time when the unborn baby (fetus) is growing rapidly. At the end of the ninth month, the fetus is about 20 inches in length and weighs 6-10 pounds. Body changes during your third trimester Your body will continue to go through many changes during pregnancy. The changes vary from woman to woman. During the third trimester:  Your weight will continue to increase. You can expect to gain 25-35 pounds (11-16 kg) by the end of the pregnancy.  You may begin to get stretch marks on your hips, abdomen, and breasts.  You may urinate more often because the fetus is moving lower into your pelvis and pressing on your bladder.  You may develop or continue to have heartburn. This is caused by increased hormones that slow down muscles in the digestive tract.  You may develop or continue to have constipation because increased hormones slow digestion and cause the muscles that push waste through your intestines to relax.  You may develop hemorrhoids. These are swollen veins (varicose veins) in the rectum that can itch or be painful.  You may develop swollen, bulging veins (varicose veins) in your legs.  You may have increased body aches in the pelvis, back, or thighs. This is due to weight gain and increased hormones that are relaxing your joints.  You may have changes in your hair. These can include thickening of your hair, rapid growth, and changes in texture. Some women also have hair loss during or after pregnancy, or hair that feels dry or thin. Your hair will most likely return to normal after your baby is born.  Your breasts will continue to grow and they will continue to become tender. A yellow fluid (colostrum) may leak from your breasts. This is the first milk you are producing for your baby.  Your belly button may stick out.  You may notice more swelling in your hands,  face, or ankles.  You may have increased tingling or numbness in your hands, arms, and legs. The skin on your belly may also feel numb.  You may feel short of breath because of your expanding uterus.  You may have more problems sleeping. This can be caused by the size of your belly, increased need to urinate, and an increase in your body's metabolism.  You may notice the fetus "dropping," or moving lower in your abdomen (lightening).  You may have increased vaginal discharge.  You may notice your joints feel loose and you may have pain around your pelvic bone.  What to expect at prenatal visits You will have prenatal exams every 2 weeks until week 36. Then you will have weekly prenatal exams. During a routine prenatal visit:  You will be weighed to make sure you and the baby are growing normally.  Your blood pressure will be taken.  Your abdomen will be measured to track your baby's growth.  The fetal heartbeat will be listened to.  Any test results from the previous visit will be discussed.  You may have a cervical check near your due date to see if your cervix has softened or thinned (effaced).  You will be tested for Group B streptococcus. This happens between 35 and 37 weeks.  Your health care provider may ask you:  What your birth plan is.  How you are feeling.  If you are feeling the baby move.  If you have had   any abnormal symptoms, such as leaking fluid, bleeding, severe headaches, or abdominal cramping.  If you are using any tobacco products, including cigarettes, chewing tobacco, and electronic cigarettes.  If you have any questions.  Other tests or screenings that may be performed during your third trimester include:  Blood tests that check for low iron levels (anemia).  Fetal testing to check the health, activity level, and growth of the fetus. Testing is done if you have certain medical conditions or if there are problems during the  pregnancy.  Nonstress test (NST). This test checks the health of your baby to make sure there are no signs of problems, such as the baby not getting enough oxygen. During this test, a belt is placed around your belly. The baby is made to move, and its heart rate is monitored during movement.  What is false labor? False labor is a condition in which you feel small, irregular tightenings of the muscles in the womb (contractions) that usually go away with rest, changing position, or drinking water. These are called Braxton Hicks contractions. Contractions may last for hours, days, or even weeks before true labor sets in. If contractions come at regular intervals, become more frequent, increase in intensity, or become painful, you should see your health care provider. What are the signs of labor?  Abdominal cramps.  Regular contractions that start at 10 minutes apart and become stronger and more frequent with time.  Contractions that start on the top of the uterus and spread down to the lower abdomen and back.  Increased pelvic pressure and dull back pain.  A watery or bloody mucus discharge that comes from the vagina.  Leaking of amniotic fluid. This is also known as your "water breaking." It could be a slow trickle or a gush. Let your health care provider know if it has a color or strange odor. If you have any of these signs, call your health care provider right away, even if it is before your due date. Follow these instructions at home: Medicines  Follow your health care provider's instructions regarding medicine use. Specific medicines may be either safe or unsafe to take during pregnancy.  Take a prenatal vitamin that contains at least 600 micrograms (mcg) of folic acid.  If you develop constipation, try taking a stool softener if your health care provider approves. Eating and drinking  Eat a balanced diet that includes fresh fruits and vegetables, whole grains, good sources of protein  such as meat, eggs, or tofu, and low-fat dairy. Your health care provider will help you determine the amount of weight gain that is right for you.  Avoid raw meat and uncooked cheese. These carry germs that can cause birth defects in the baby.  If you have low calcium intake from food, talk to your health care provider about whether you should take a daily calcium supplement.  Eat four or five small meals rather than three large meals a day.  Limit foods that are high in fat and processed sugars, such as fried and sweet foods.  To prevent constipation: ? Drink enough fluid to keep your urine clear or pale yellow. ? Eat foods that are high in fiber, such as fresh fruits and vegetables, whole grains, and beans. Activity  Exercise only as directed by your health care provider. Most women can continue their usual exercise routine during pregnancy. Try to exercise for 30 minutes at least 5 days a week. Stop exercising if you experience uterine contractions.  Avoid heavy   lifting.  Do not exercise in extreme heat or humidity, or at high altitudes.  Wear low-heel, comfortable shoes.  Practice good posture.  You may continue to have sex unless your health care provider tells you otherwise. Relieving pain and discomfort  Take frequent breaks and rest with your legs elevated if you have leg cramps or low back pain.  Take warm sitz baths to soothe any pain or discomfort caused by hemorrhoids. Use hemorrhoid cream if your health care provider approves.  Wear a good support bra to prevent discomfort from breast tenderness.  If you develop varicose veins: ? Wear support pantyhose or compression stockings as told by your healthcare provider. ? Elevate your feet for 15 minutes, 3-4 times a day. Prenatal care  Write down your questions. Take them to your prenatal visits.  Keep all your prenatal visits as told by your health care provider. This is important. Safety  Wear your seat belt at  all times when driving.  Make a list of emergency phone numbers, including numbers for family, friends, the hospital, and police and fire departments. General instructions  Avoid cat litter boxes and soil used by cats. These carry germs that can cause birth defects in the baby. If you have a cat, ask someone to clean the litter box for you.  Do not travel far distances unless it is absolutely necessary and only with the approval of your health care provider.  Do not use hot tubs, steam rooms, or saunas.  Do not drink alcohol.  Do not use any products that contain nicotine or tobacco, such as cigarettes and e-cigarettes. If you need help quitting, ask your health care provider.  Do not use any medicinal herbs or unprescribed drugs. These chemicals affect the formation and growth of the baby.  Do not douche or use tampons or scented sanitary pads.  Do not cross your legs for long periods of time.  To prepare for the arrival of your baby: ? Take prenatal classes to understand, practice, and ask questions about labor and delivery. ? Make a trial run to the hospital. ? Visit the hospital and tour the maternity area. ? Arrange for maternity or paternity leave through employers. ? Arrange for family and friends to take care of pets while you are in the hospital. ? Purchase a rear-facing car seat and make sure you know how to install it in your car. ? Pack your hospital bag. ? Prepare the baby's nursery. Make sure to remove all pillows and stuffed animals from the baby's crib to prevent suffocation.  Visit your dentist if you have not gone during your pregnancy. Use a soft toothbrush to brush your teeth and be gentle when you floss. Contact a health care provider if:  You are unsure if you are in labor or if your water has broken.  You become dizzy.  You have mild pelvic cramps, pelvic pressure, or nagging pain in your abdominal area.  You have lower back pain.  You have persistent  nausea, vomiting, or diarrhea.  You have an unusual or bad smelling vaginal discharge.  You have pain when you urinate. Get help right away if:  Your water breaks before 37 weeks.  You have regular contractions less than 5 minutes apart before 37 weeks.  You have a fever.  You are leaking fluid from your vagina.  You have spotting or bleeding from your vagina.  You have severe abdominal pain or cramping.  You have rapid weight loss or weight gain.    You have shortness of breath with chest pain.  You notice sudden or extreme swelling of your face, hands, ankles, feet, or legs.  Your baby makes fewer than 10 movements in 2 hours.  You have severe headaches that do not go away when you take medicine.  You have vision changes. Summary  The third trimester is from week 28 through week 40, months 7 through 9. The third trimester is a time when the unborn baby (fetus) is growing rapidly.  During the third trimester, your discomfort may increase as you and your baby continue to gain weight. You may have abdominal, leg, and back pain, sleeping problems, and an increased need to urinate.  During the third trimester your breasts will keep growing and they will continue to become tender. A yellow fluid (colostrum) may leak from your breasts. This is the first milk you are producing for your baby.  False labor is a condition in which you feel small, irregular tightenings of the muscles in the womb (contractions) that eventually go away. These are called Braxton Hicks contractions. Contractions may last for hours, days, or even weeks before true labor sets in.  Signs of labor can include: abdominal cramps; regular contractions that start at 10 minutes apart and become stronger and more frequent with time; watery or bloody mucus discharge that comes from the vagina; increased pelvic pressure and dull back pain; and leaking of amniotic fluid. This information is not intended to replace advice  given to you by your health care provider. Make sure you discuss any questions you have with your health care provider. Document Released: 12/07/2001 Document Revised: 05/20/2016 Document Reviewed: 02/13/2013 [employer] Interactive Patient Education  2017 [employer]  

## 2017-04-20 NOTE — Progress Notes (Signed)
   PRENATAL VISIT NOTE  Subjective:  Michelle Neal is a 26 y.o. G3P1011 at [redacted]w[redacted]d being seen today for ongoing prenatal care.  She is currently monitored for the following issues for this low-risk pregnancy and has Supervision of normal pregnancy, antepartum; Anemia in pregnancy; Thrombocytopenia affecting pregnancy (HCC); and LGSIL on Pap smear of cervix on her problem list.  Patient reports no complaints.  Contractions: Not present. Vag. Bleeding: None.  Movement: Present. Denies leaking of fluid.   The following portions of the patient's history were reviewed and updated as appropriate: allergies, current medications, past family history, past medical history, past social history, past surgical history and problem list. Problem list updated.  Objective:   Vitals:   04/20/17 0902  BP: 95/64  Pulse: 93  Weight: 173 lb (78.5 kg)    Fetal Status: Fetal Heart Rate (bpm): 147 Fundal Height: 28 cm Movement: Present     General:  Alert, oriented and cooperative. Patient is in no acute distress.  Skin: Skin is warm and dry. No rash noted.   Cardiovascular: Normal heart rate noted  Respiratory: Normal respiratory effort, no problems with respiration noted  Abdomen: Soft, gravid, appropriate for gestational age. Pain/Pressure: Absent     Pelvic:  Cervical exam deferred        Extremities: Normal range of motion.  Edema: Trace  Mental Status: Normal mood and affect. Normal behavior. Normal judgment and thought content.   Assessment and Plan:  Pregnancy: G3P1011 at [redacted]w[redacted]d  1. Headache in pregnancy, antepartum, third trimester Advised hydration, if continues will do referral - Butalbital-APAP-Caffeine 50-325-40 MG capsule; Take 1-2 capsules by mouth every 6 (six) hours as needed for headache.  Dispense: 30 capsule; Refill: 3  2. Thrombocytopenia affecting pregnancy (HCC) - CBC  3. Encounter for supervision of normal pregnancy, antepartum, unspecified gravidity Third trimester labs today,  Tdap also today. - CBC - Glucose Tolerance, 2 Hours w/1 Hour - RPR - HIV antibody - Tdap vaccine greater than or equal to 7yo IM Preterm labor symptoms and general obstetric precautions including but not limited to vaginal bleeding, contractions, leaking of fluid and fetal movement were reviewed in detail with the patient. Please refer to After Visit Summary for other counseling recommendations.  Return in about 4 weeks (around 05/18/2017) for OB 32 week visit (Babyscripts).   Tereso Newcomer, MD

## 2017-04-21 LAB — CBC
HEMOGLOBIN: 10.2 g/dL — AB (ref 11.1–15.9)
Hematocrit: 30.8 % — ABNORMAL LOW (ref 34.0–46.6)
MCH: 31.4 pg (ref 26.6–33.0)
MCHC: 33.1 g/dL (ref 31.5–35.7)
MCV: 95 fL (ref 79–97)
PLATELETS: 149 10*3/uL — AB (ref 150–379)
RBC: 3.25 x10E6/uL — ABNORMAL LOW (ref 3.77–5.28)
RDW: 13.6 % (ref 12.3–15.4)
WBC: 7.2 10*3/uL (ref 3.4–10.8)

## 2017-04-21 LAB — GLUCOSE TOLERANCE, 2 HOURS W/ 1HR
GLUCOSE, 1 HOUR: 98 mg/dL (ref 65–179)
GLUCOSE, FASTING: 69 mg/dL (ref 65–91)
Glucose, 2 hour: 79 mg/dL (ref 65–152)

## 2017-04-21 LAB — RPR: RPR Ser Ql: NONREACTIVE

## 2017-04-21 LAB — HIV ANTIBODY (ROUTINE TESTING W REFLEX): HIV SCREEN 4TH GENERATION: NONREACTIVE

## 2017-04-24 IMAGING — US US MFM OB FOLLOW-UP
1 series · 14 of 28 positions shown · non-contrast
Comparison: none

[Series 1: us mfm ob follow-up · 44 acquisitions, 14 frames shown]
[im 2/44]
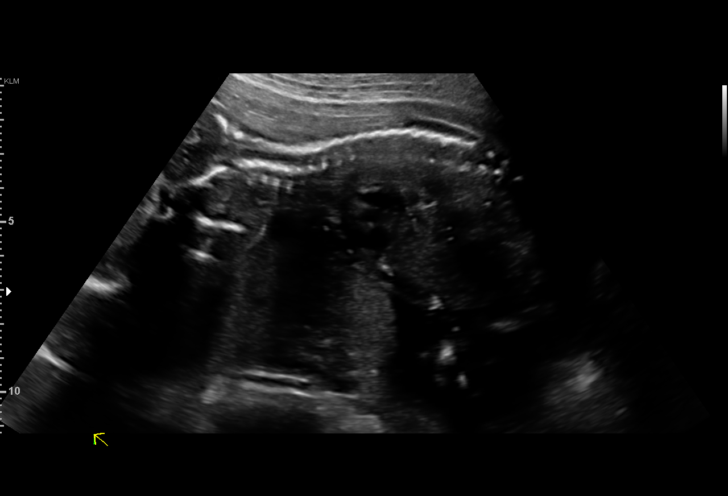
[im 5/44]
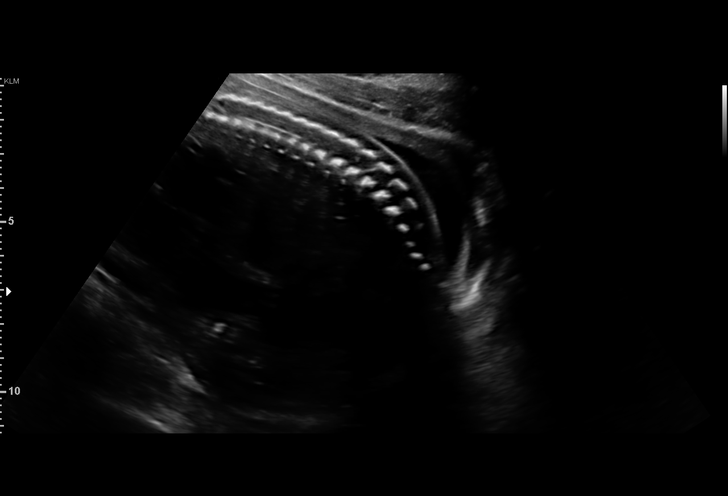
[im 8/44]
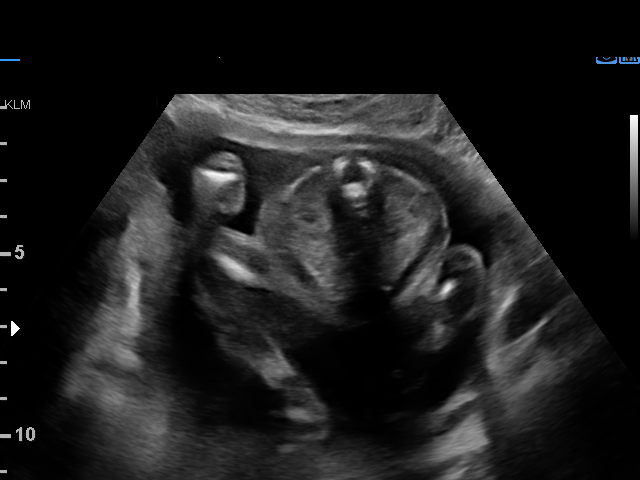
[im 12/44]
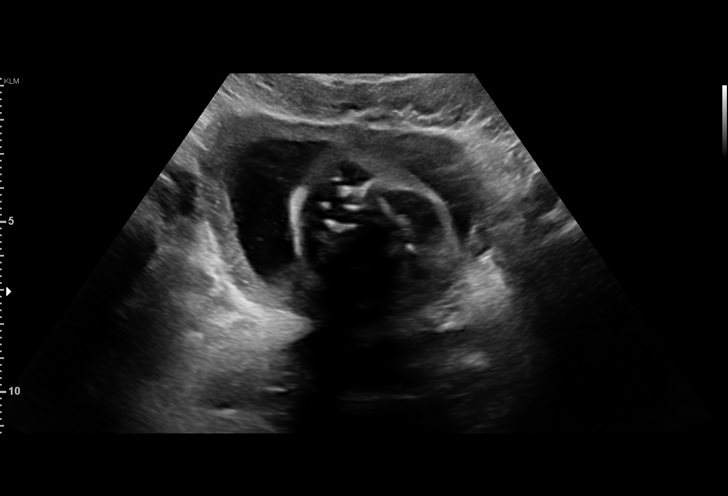
[im 15/44]
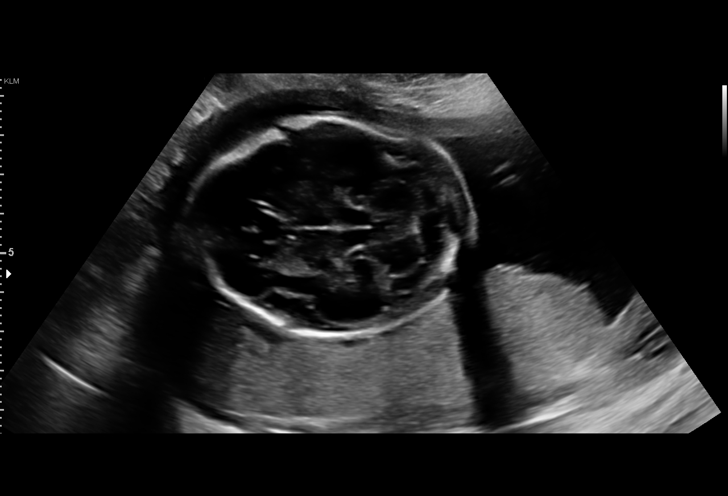
[im 18/44]
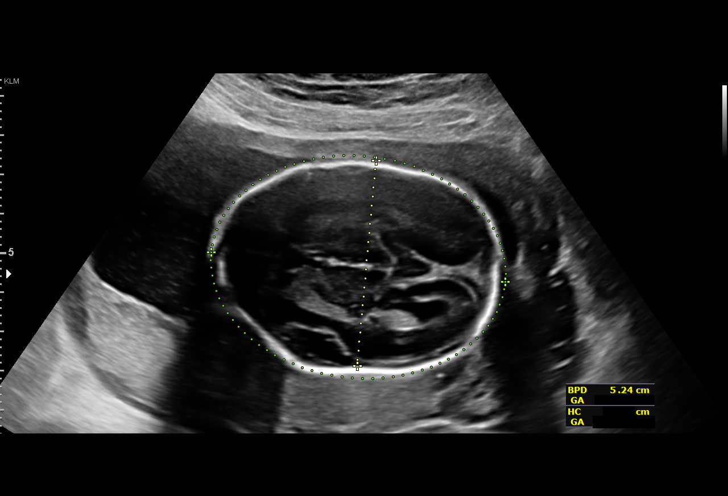
[im 21/44]
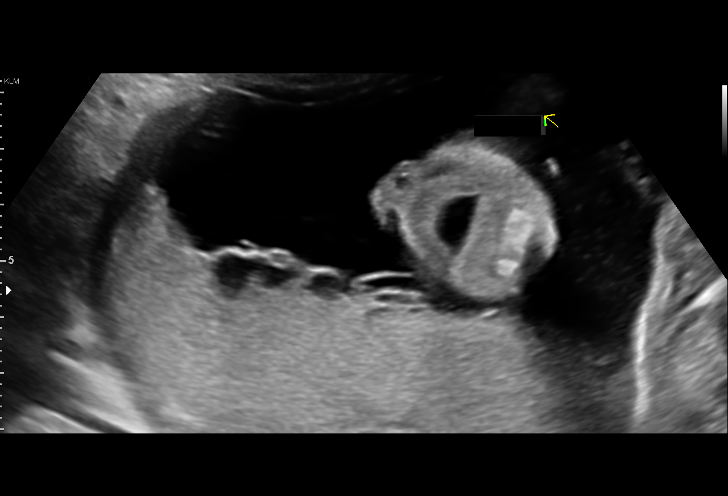
[im 24/44]
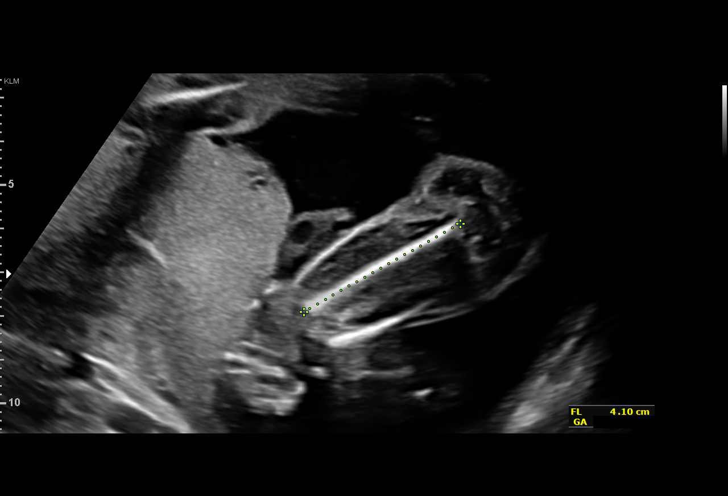
[im 28/44]
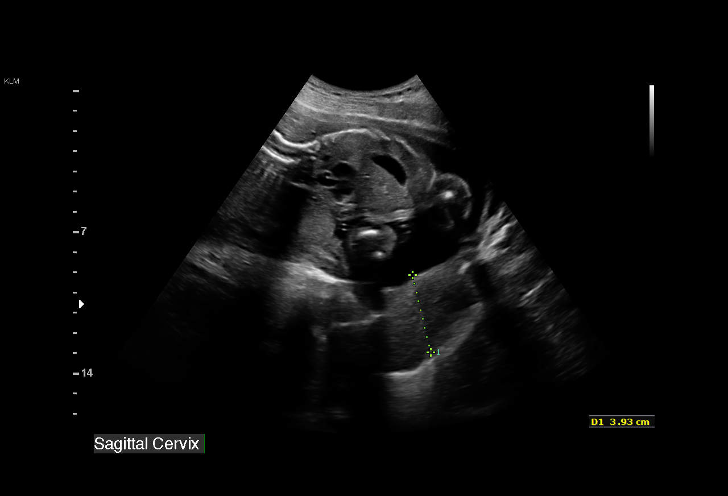
[im 31/44]
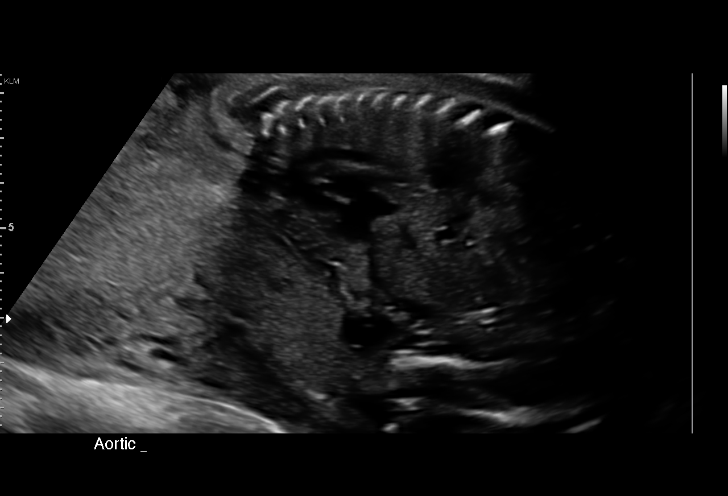
[im 34/44]
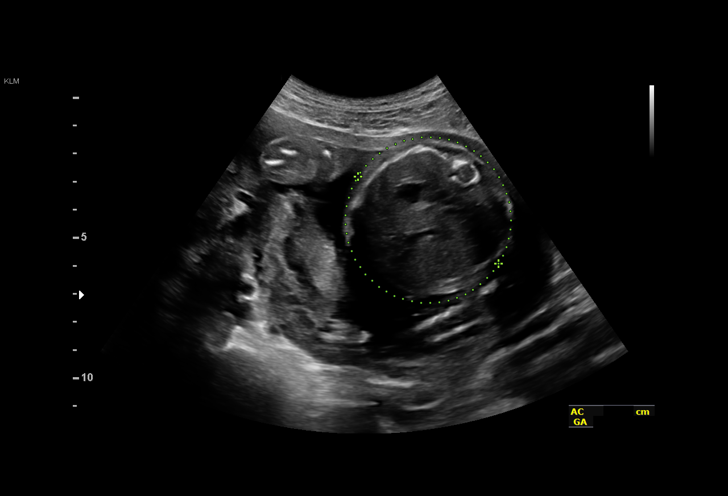
[im 37/44]
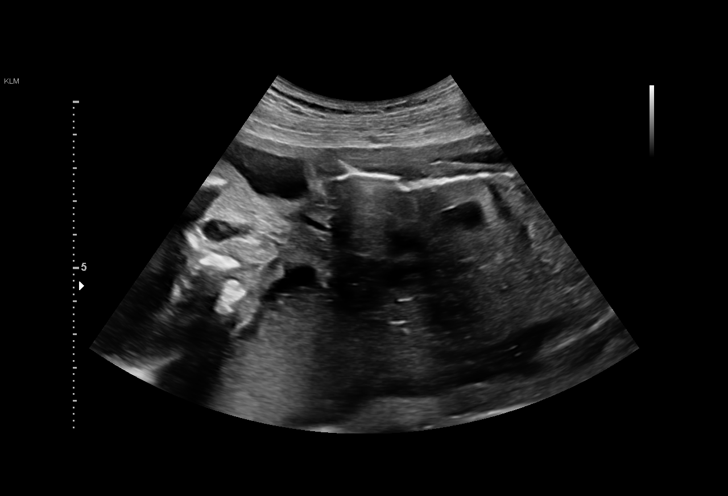
[im 40/44]
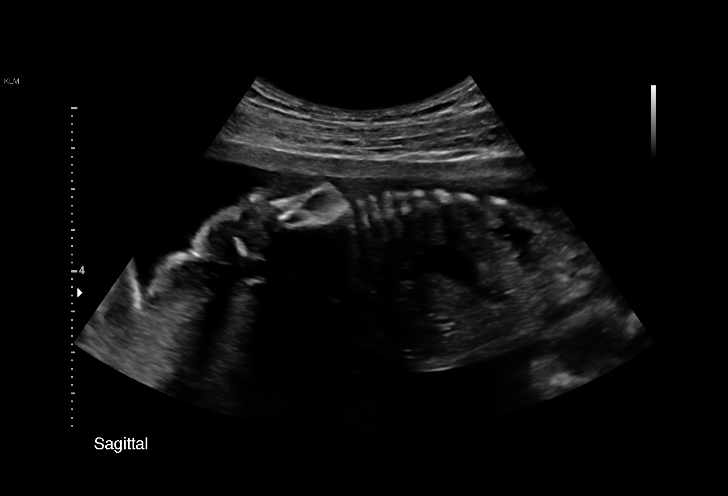
[im 44/44]
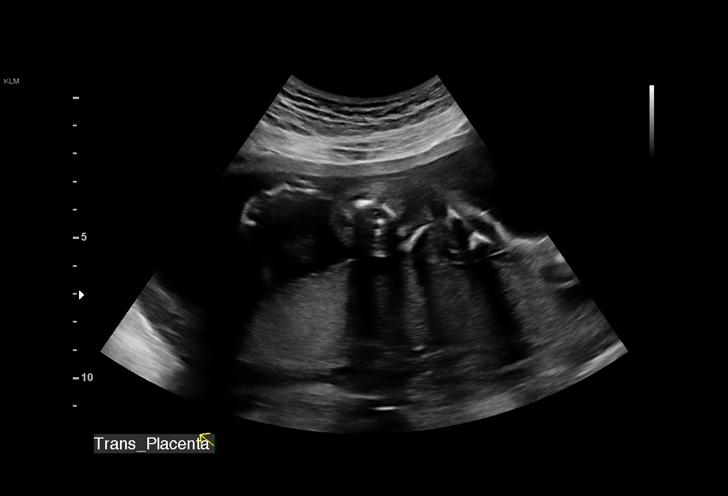

[14 of 28 positions shown; findings below may reference images not displayed]

1  [facility]          [id number]      [id number]     [id number]
Indications

22 weeks gestation of pregnancy
Antenatal follow-up for nonvisualized fetal
anatomy
OB History

Blood Type:            Height:  5'4"   Weight (lb):  150      BMI:
Gravidity:    3         Term:   1        Prem:   0        SAB:   0
TOP:          1       Ectopic:  0        Living: 1
Fetal Evaluation

Num Of Fetuses:     1
Fetal Heart         164
Rate(bpm):
Cardiac Activity:   Observed
Presentation:       Breech
Placenta:           Posterior, above cervical os
P. Cord Insertion:  Visualized

Amniotic Fluid
AFI FV:      Subjectively within normal limits
Biometry

BPD:      53.8  mm     G. Age:  22w 2d         32  %    CI:        67.34   %   70 - 86
FL/HC:      19.6   %   19.2 -
HC:      209.9  mm     G. Age:  23w 1d         51  %    HC/AC:      1.15       1.05 -
AC:      183.3  mm     G. Age:  23w 1d         56  %    FL/BPD:     76.4   %   71 - 87
FL:       41.1  mm     G. Age:  23w 2d         60  %    FL/AC:      22.4   %   20 - 24

Est. FW:     570  gm      1 lb 4 oz     59  %
Gestational Age

LMP:           21w 5d       Date:   10/15/16                 EDD:   07/22/17
U/S Today:     23w 0d                                        EDD:   07/13/17
Best:          22w 5d    Det. By:   Early Ultrasound         EDD:   07/15/17
(12/29/16)
Anatomy

Cranium:               Appears normal         Aortic Arch:            Appears normal
Cavum:                 Appears normal         Ductal Arch:            Previously seen
Ventricles:            Appears normal         Diaphragm:              Appears normal
Choroid Plexus:        Previously seen        Stomach:                Appears normal, left
sided
Cerebellum:            Appears normal         Abdomen:                Appears normal
Posterior Fossa:       Appears normal         Abdominal Wall:         Previously seen
Nuchal Fold:           Not applicable (>20    Cord Vessels:           Previously seen
wks GA)
Face:                  Appears normal         Kidneys:                Appear normal
(orbits and profile)
Lips:                  Appears normal         Bladder:                Appears normal
Thoracic:              Appears normal         Spine:                  Appears normal
Heart:                 Previously seen        Upper Extremities:      Previously seen
RVOT:                  Previously seen        Lower Extremities:      Previously seen
LVOT:                  Previously seen

Other:  Parents do not wish to know sex of fetus at this time. Heels previously
visualized.
Cervix Uterus Adnexa

Cervix
Length:            3.9  cm.
Normal appearance by transabdominal scan.
Impression

SIUP at 22+5 weeks
Normal interval anatomy; anatomic survey complete
Normal amniotic fluid volume
Appropriate interval growth with EFW at the 59th %tile
Recommendations

Follow-up as clinically indicated

## 2017-05-18 ENCOUNTER — Ambulatory Visit (INDEPENDENT_AMBULATORY_CARE_PROVIDER_SITE_OTHER): Payer: 59 | Admitting: Obstetrics & Gynecology

## 2017-05-18 DIAGNOSIS — Z3480 Encounter for supervision of other normal pregnancy, unspecified trimester: Secondary | ICD-10-CM

## 2017-05-18 DIAGNOSIS — Z3482 Encounter for supervision of other normal pregnancy, second trimester: Secondary | ICD-10-CM

## 2017-05-18 NOTE — Progress Notes (Signed)
   PRENATAL VISIT NOTE  Subjective:  Michelle Neal is a 26 y.o. G3P1011 at 5959w5d being seen today for ongoing prenatal care.  She is currently monitored for the following issues for this low-risk pregnancy and has Supervision of normal pregnancy, antepartum; Anemia in pregnancy; Thrombocytopenia affecting pregnancy (HCC); and LGSIL on Pap smear of cervix on her problem list.  Patient reports no complaints.  Contractions: Not present. Vag. Bleeding: None.  Movement: Present. Denies leaking of fluid.   The following portions of the patient's history were reviewed and updated as appropriate: allergies, current medications, past family history, past medical history, past social history, past surgical history and problem list. Problem list updated.  Objective:   Vitals:   05/18/17 0813  BP: 104/65  Pulse: 96  Weight: 180 lb (81.6 kg)    Fetal Status: Fetal Heart Rate (bpm): 156 Fundal Height: 32 cm Movement: Present     General:  Alert, oriented and cooperative. Patient is in no acute distress.  Skin: Skin is warm and dry. No rash noted.   Cardiovascular: Normal heart rate noted  Respiratory: Normal respiratory effort, no problems with respiration noted  Abdomen: Soft, gravid, appropriate for gestational age. Pain/Pressure: Present     Pelvic:  Cervical exam deferred        Extremities: Normal range of motion.  Edema: Trace  Mental Status: Normal mood and affect. Normal behavior. Normal judgment and thought content.   Results for orders placed or performed in visit on 04/20/17 (from the past 672 hour(s))  CBC   Collection Time: 04/20/17  8:51 AM  Result Value Ref Range   WBC 7.2 3.4 - 10.8 x10E3/uL   RBC 3.25 (L) 3.77 - 5.28 x10E6/uL   Hemoglobin 10.2 (L) 11.1 - 15.9 g/dL   Hematocrit 16.130.8 (L) 09.634.0 - 46.6 %   MCV 95 79 - 97 fL   MCH 31.4 26.6 - 33.0 pg   MCHC 33.1 31.5 - 35.7 g/dL   RDW 04.513.6 40.912.3 - 81.115.4 %   Platelets 149 (L) 150 - 379 x10E3/uL  Glucose Tolerance, 2 Hours w/1  Hour   Collection Time: 04/20/17  8:51 AM  Result Value Ref Range   Glucose, Fasting 69 65 - 91 mg/dL   Glucose, 1 hour 98 65 - 179 mg/dL   Glucose, 2 hour 79 65 - 152 mg/dL  RPR   Collection Time: 04/20/17  8:51 AM  Result Value Ref Range   RPR Ser Ql Non Reactive Non Reactive  HIV antibody   Collection Time: 04/20/17  8:51 AM  Result Value Ref Range   HIV Screen 4th Generation wRfx Non Reactive Non Reactive    Assessment and Plan:  Pregnancy: G3P1011 at 6459w5d  1. Supervision of other normal pregnancy, antepartum Doing well with Babyscripts. Normal third trimester labs reviewed with patient. Preterm labor symptoms and general obstetric precautions including but not limited to vaginal bleeding, contractions, leaking of fluid and fetal movement were reviewed in detail with the patient. Please refer to After Visit Summary for other counseling recommendations.  Return in about 4 weeks (around 06/15/2017) for Pelvic cultures, OB 36 week visit (Babyscripts).   Jaynie CollinsUgonna Anyanwu, MD

## 2017-05-18 NOTE — Patient Instructions (Signed)
Return to clinic for any scheduled appointments or obstetric concerns, or go to [facility] for evaluation  

## 2017-06-10 ENCOUNTER — Observation Stay
Admission: EM | Admit: 2017-06-10 | Discharge: 2017-06-10 | Disposition: A | Discharge status: Home / Self Care | Payer: 59 | Attending: Obstetrics and Gynecology | Admitting: Obstetrics and Gynecology

## 2017-06-10 DIAGNOSIS — R51 Headache: Secondary | ICD-10-CM | POA: Insufficient documentation

## 2017-06-10 DIAGNOSIS — D696 Thrombocytopenia, unspecified: Secondary | ICD-10-CM

## 2017-06-10 DIAGNOSIS — Z79899 Other long term (current) drug therapy: Secondary | ICD-10-CM | POA: Insufficient documentation

## 2017-06-10 DIAGNOSIS — Z3A35 35 weeks gestation of pregnancy: Secondary | ICD-10-CM

## 2017-06-10 DIAGNOSIS — O26893 Other specified pregnancy related conditions, third trimester: Principal | ICD-10-CM | POA: Insufficient documentation

## 2017-06-10 DIAGNOSIS — O99012 Anemia complicating pregnancy, second trimester: Secondary | ICD-10-CM

## 2017-06-10 DIAGNOSIS — R87612 Low grade squamous intraepithelial lesion on cytologic smear of cervix (LGSIL): Secondary | ICD-10-CM

## 2017-06-10 DIAGNOSIS — O99119 Other diseases of the blood and blood-forming organs and certain disorders involving the immune mechanism complicating pregnancy, unspecified trimester: Secondary | ICD-10-CM

## 2017-06-10 DIAGNOSIS — Z3480 Encounter for supervision of other normal pregnancy, unspecified trimester: Secondary | ICD-10-CM

## 2017-06-10 NOTE — Discharge Summary (Signed)
See final progress note. 

## 2017-06-10 NOTE — OB Triage Note (Signed)
Patient came in with c/o of headache since this morning. Patient took Production designer, theatre/television/filmButalbital/acetamin. 1 capsule at 0900 and then another at 1900. Patient states she usually feels better with one capsule but this time her headache returned. When she has a headche rating pain 8 or 9, pounding headache. Patient also reports lower bilateral edema, which is worse in the morning. Edema is currently +1 pitting. Denies epigastric pain. Denies vision disturbances. Patient also reports intermit. Lower abdominal pain of 6 to 7. Patient reports decrease appetite and overall malaise.

## 2017-06-10 NOTE — Final Progress Note (Signed)
Physician Final Progress Note  Patient ID: Michelle Neal MRN: [id number] DOB/AGE: [birth year] 26 y.o.  Admit date: 06/10/2017 Admitting provider: Conard NovakStephen D Shawana Knoch, MD Discharge date: 06/10/2017   Admission Diagnoses:  1) intrauterine pregnancy at 10470w0d 2) headache  Discharge Diagnoses:  1) intrauterine pregnancy at 3170w0d 2) headache  History of Present Illness: The patient is a 26 y.o. female G3P1011 at 2870w0d who presents for several symptoms. During her pregnancy she has frequently had headaches for which she takes fioricet.  She had a headache this morning and took her usual fioricet around 9 am today.  Her headache came back soon after and she took another fioricet at 7 pm, which is not typical for her. She also felt some pounding in her right ear and was sweating throughout the day. She was sweating such that she had to change shirts at one point. She did not feel febrile nor as if she had the chills.  She had no other symptoms. She notes +FM, no LOF, no vaginal bleeding and no contractions.   At the time of my interview with her, she was having no symptoms of anything (no headache, sweating, etc.)  Hospital Course: the patient was admitted to observation for the above. She was monitored with a very reactive fetal tracing, normal blood pressures, no fever. No focal findings of any sort.  As she was having no symptoms at the time of her presentation and her evaluation was reassuring, she was discharged with precautions for labor and for return and/or worsening of her prior symptoms.  Past Medical History: healthy  Past surgical history: denies  No current facility-administered medications on file prior to encounter.    Current Outpatient Prescriptions on File Prior to Encounter  Medication Sig Dispense Refill  . Butalbital-APAP-Caffeine 50-325-40 MG capsule Take 1-2 capsules by mouth every 6 (six) hours as needed for headache. 30 capsule 3  . Prenatal Vit-Fe Fumarate-FA  (MULTIVITAMIN-PRENATAL) 27-0.8 MG TABS tablet Take 1 tablet by mouth daily at 12 noon.    Marland Kitchen. acetaminophen (TYLENOL) 325 MG tablet Take 650 mg by mouth every 6 (six) hours as needed.    . Cholecalciferol 50000 units TABS Take 1 tablet by mouth once a week.    . Dapsone (ACZONE) 7.5 % GEL APPLY TO THE FACE EVERY MORNING    . ferrous gluconate (FERGON) 324 MG tablet Take 1 tablet by mouth daily.     Allergies: No Known Allergies  Social History   Social History  . Marital status: Single    Spouse name: N/A  . Number of children: N/A  . Years of education: N/A   Occupational History  . Not on file.   Social History Main Topics  . Smoking status: Never Smoker  . Smokeless tobacco: Never Used  . Alcohol use Yes     Comment: Before pregnancy  . Drug use: No  . Sexual activity: Yes    Partners: Male    Birth control/ protection: None   Other Topics Concern  . Not on file   Social History Narrative  . No narrative on file   Review of Systems  Constitutional: Negative.   HENT: Negative.   Eyes: Negative.   Respiratory: Negative.   Cardiovascular: Negative.   Gastrointestinal: Negative.   Genitourinary: Negative.   Musculoskeletal: Negative.   Skin: Negative.   Neurological: Negative.   Psychiatric/Behavioral: Negative.      Physical Exam: BP 111/64   Pulse 100   Temp 97.4 F (36.3 C) (Oral)  LMP 10/15/2016   Physical Exam  Constitutional: She is oriented to person, place, and time. She appears well-developed and well-nourished. No distress.  HENT:  Head: Normocephalic and atraumatic.  Eyes: Conjunctivae are normal.  Neck: Normal range of motion. Neck supple.  Cardiovascular: Normal rate, regular rhythm and normal heart sounds.   Pulmonary/Chest: Effort normal and breath sounds normal. She has no wheezes.  Abdominal: Soft. She exhibits no distension. There is no tenderness. There is no guarding. Hernia confirmed negative in the right inguinal area and confirmed  negative in the left inguinal area.  Gravid, NT  Genitourinary: Pelvic exam was performed with patient supine. There is no rash, tenderness or lesion on the right labia. There is no rash, tenderness or lesion on the left labia.  Musculoskeletal: Normal range of motion.  Lymphadenopathy:       Right: No inguinal adenopathy present.       Left: No inguinal adenopathy present.  Neurological: She is alert and oriented to person, place, and time.  Skin: Skin is warm and dry. No rash noted. She is not diaphoretic.  Psychiatric: She has a normal mood and affect. Her behavior is normal. Judgment normal.    Consults: None  Significant Findings/ Diagnostic Studies: none  Procedures: NST Baseline FHR: 135 beats/min Variability: moderate Accelerations: present Decelerations: absent Tocometry: initially not present, she did later have a run of contractions coming 3 q 10 minutes, though she was not feeling anything.  Interpretation:  INDICATIONS: headache RESULTS:  A NST procedure was performed with FHR monitoring and a normal baseline established, appropriate time of 20-40 minutes of evaluation, and accels >2 seen w 15x15 characteristics.  Results show a REACTIVE NST.    Discharge Condition: stable  Disposition: 01-Home or Self Care  Diet: Regular diet  Discharge Activity: Activity as tolerated   Allergies as of 06/10/2017   No Known Allergies     Medication List    TAKE these medications   acetaminophen 325 MG tablet Commonly known as:  TYLENOL Take 650 mg by mouth every 6 (six) hours as needed.   ACZONE 7.5 % Gel Generic drug:  Dapsone APPLY TO THE FACE EVERY MORNING   Butalbital-APAP-Caffeine 50-325-40 MG capsule Take 1-2 capsules by mouth every 6 (six) hours as needed for headache.   Cholecalciferol 50000 units Tabs Take 1 tablet by mouth once a week.   ferrous gluconate 324 MG tablet Commonly known as:  FERGON Take 1 tablet by mouth daily.   multivitamin-prenatal  27-0.8 MG Tabs tablet Take 1 tablet by mouth daily at 12 noon.      Follow-up Information    [facility] Follow up on 06/15/2017.   Specialty:  Obstetrics and Gynecology Why:  keep previously scheduled appointment Contact information: [address] [place] [address] [phone]          Total time spent taking care of this patient: 30 minutes  Signed: Thomasene Mohair, MD  06/10/2017, 10:38 PM

## 2017-06-15 ENCOUNTER — Ambulatory Visit (INDEPENDENT_AMBULATORY_CARE_PROVIDER_SITE_OTHER): Payer: 59 | Admitting: Obstetrics and Gynecology

## 2017-06-15 VITALS — BP 111/72 | HR 94 | Wt 183.0 lb

## 2017-06-15 DIAGNOSIS — Z3480 Encounter for supervision of other normal pregnancy, unspecified trimester: Secondary | ICD-10-CM

## 2017-06-15 DIAGNOSIS — Z3483 Encounter for supervision of other normal pregnancy, third trimester: Secondary | ICD-10-CM

## 2017-06-15 DIAGNOSIS — Z113 Encounter for screening for infections with a predominantly sexual mode of transmission: Secondary | ICD-10-CM | POA: Diagnosis not present

## 2017-06-15 LAB — OB RESULTS CONSOLE GC/CHLAMYDIA: GC PROBE AMP, GENITAL: NEGATIVE

## 2017-06-15 NOTE — Progress Notes (Signed)
   PRENATAL VISIT NOTE  Subjective:  Michelle Neal is a 26 y.o. G3P1011 at 6560w5d being seen today for ongoing prenatal care.  She is currently monitored for the following issues for this low-risk pregnancy and has Supervision of normal pregnancy, antepartum; Anemia in pregnancy; Thrombocytopenia affecting pregnancy (HCC); LGSIL on Pap smear of cervix; and Labor and delivery, indication for care on her problem list.  Patient reports no complaints.  Contractions: Irregular. Vag. Bleeding: None.  Movement: Present. Denies leaking of fluid.   The following portions of the patient's history were reviewed and updated as appropriate: allergies, current medications, past family history, past medical history, past social history, past surgical history and problem list. Problem list updated.  Objective:   Vitals:   06/15/17 0957  BP: 111/72  Pulse: 94  Weight: 183 lb (83 kg)    Fetal Status:     Movement: Present     General:  Alert, oriented and cooperative. Patient is in no acute distress.  Skin: Skin is warm and dry. No rash noted.   Cardiovascular: Normal heart rate noted  Respiratory: Normal respiratory effort, no problems with respiration noted  Abdomen: Soft, gravid, appropriate for gestational age. Pain/Pressure: Present     Pelvic:  Cervical exam performed        Extremities: Normal range of motion.  Edema: Trace  Mental Status: Normal mood and affect. Normal behavior. Normal judgment and thought content.   Assessment and Plan:  Pregnancy: G3P1011 at 3360w5d  1. Supervision of other normal pregnancy, antepartum Patient is doing well Cultures today Repeat cbc to assess thrombocytopenia today  Preterm labor symptoms and general obstetric precautions including but not limited to vaginal bleeding, contractions, leaking of fluid and fetal movement were reviewed in detail with the patient. Please refer to After Visit Summary for other counseling recommendations.  No Follow-up on  file.   Catalina AntiguaPeggy Daveyon Kitchings, MD

## 2017-06-16 LAB — CBC
HEMATOCRIT: 30.7 % — AB (ref 34.0–46.6)
HEMOGLOBIN: 9.9 g/dL — AB (ref 11.1–15.9)
MCH: 30.7 pg (ref 26.6–33.0)
MCHC: 32.2 g/dL (ref 31.5–35.7)
MCV: 95 fL (ref 79–97)
Platelets: 133 10*3/uL — ABNORMAL LOW (ref 150–379)
RBC: 3.22 x10E6/uL — AB (ref 3.77–5.28)
RDW: 13.6 % (ref 12.3–15.4)
WBC: 7.5 10*3/uL (ref 3.4–10.8)

## 2017-06-16 LAB — GC/CHLAMYDIA PROBE AMP (~~LOC~~) NOT AT ARMC
Chlamydia: NEGATIVE
Neisseria Gonorrhea: NEGATIVE

## 2017-06-17 LAB — STREP GP B NAA: Strep Gp B NAA: POSITIVE — AB

## 2017-06-20 ENCOUNTER — Encounter: Payer: Self-pay | Admitting: Obstetrics and Gynecology

## 2017-06-20 DIAGNOSIS — O99820 Streptococcus B carrier state complicating pregnancy: Secondary | ICD-10-CM | POA: Insufficient documentation

## 2017-07-01 ENCOUNTER — Ambulatory Visit (INDEPENDENT_AMBULATORY_CARE_PROVIDER_SITE_OTHER): Payer: 59 | Admitting: Obstetrics & Gynecology

## 2017-07-01 VITALS — BP 106/62 | HR 94 | Wt 187.0 lb

## 2017-07-01 DIAGNOSIS — D696 Thrombocytopenia, unspecified: Secondary | ICD-10-CM

## 2017-07-01 DIAGNOSIS — Z3483 Encounter for supervision of other normal pregnancy, third trimester: Secondary | ICD-10-CM

## 2017-07-01 DIAGNOSIS — O99820 Streptococcus B carrier state complicating pregnancy: Secondary | ICD-10-CM

## 2017-07-01 DIAGNOSIS — O99119 Other diseases of the blood and blood-forming organs and certain disorders involving the immune mechanism complicating pregnancy, unspecified trimester: Secondary | ICD-10-CM

## 2017-07-01 DIAGNOSIS — O99113 Other diseases of the blood and blood-forming organs and certain disorders involving the immune mechanism complicating pregnancy, third trimester: Secondary | ICD-10-CM

## 2017-07-01 DIAGNOSIS — Z3480 Encounter for supervision of other normal pregnancy, unspecified trimester: Secondary | ICD-10-CM

## 2017-07-01 NOTE — Patient Instructions (Signed)
Return to clinic for any scheduled appointments or obstetric concerns, or go to [facility] for evaluation  

## 2017-07-01 NOTE — Progress Notes (Signed)
   PRENATAL VISIT NOTE  Subjective:  Michelle Neal is a 26 y.o. G3P1011 at 2133w0d being seen today for ongoing prenatal care.  She is currently monitored for the following issues for this low-risk pregnancy and has Supervision of normal pregnancy, antepartum; Anemia in pregnancy; Thrombocytopenia affecting pregnancy (HCC); LGSIL on Pap smear of cervix; and GBS (group B Streptococcus carrier), +RV culture, currently pregnant on her problem list.  Patient reports no complaints.  Contractions: Irregular. Vag. Bleeding: None.  Movement: Present. Denies leaking of fluid.   The following portions of the patient's history were reviewed and updated as appropriate: allergies, current medications, past family history, past medical history, past social history, past surgical history and problem list. Problem list updated.  Objective:   Vitals:   07/01/17 1055  BP: 106/62  Pulse: 94  Weight: 187 lb (84.8 kg)    Fetal Status: Fetal Heart Rate (bpm): 153 Fundal Height: 37 cm Movement: Present     General:  Alert, oriented and cooperative. Patient is in no acute distress.  Skin: Skin is warm and dry. No rash noted.   Cardiovascular: Normal heart rate noted  Respiratory: Normal respiratory effort, no problems with respiration noted  Abdomen: Soft, gravid, appropriate for gestational age. Pain/Pressure: Present     Pelvic:  Cervical exam deferred        Extremities: Normal range of motion.  Edema: Trace  Mental Status: Normal mood and affect. Normal behavior. Normal judgment and thought content.   Assessment and Plan:  Pregnancy: G3P1011 at 8633w0d  1. Thrombocytopenia affecting pregnancy (HCC) Was 133k on 06/15/17, will reevaluate during admission for labor  2. GBS (group B Streptococcus carrier), +RV culture, currently pregnant Will treat in labor  3. Supervision of other normal pregnancy, antepartum Term labor symptoms and general obstetric precautions including but not limited to vaginal  bleeding, contractions, leaking of fluid and fetal movement were reviewed in detail with the patient. Please refer to After Visit Summary for other counseling recommendations.  Return in about 1 week (around 07/08/2017) for OB Visit.   Jaynie CollinsUgonna Mehreen Azizi, MD

## 2017-07-07 ENCOUNTER — Encounter (HOSPITAL_COMMUNITY): Payer: Self-pay

## 2017-07-07 ENCOUNTER — Inpatient Hospital Stay (HOSPITAL_COMMUNITY)
Admission: AD | Admit: 2017-07-07 | Discharge: 2017-07-10 | DRG: 775 | Disposition: A | Discharge status: Home / Self Care | Payer: 59 | Source: Ambulatory Visit | Attending: Obstetrics & Gynecology | Admitting: Obstetrics & Gynecology

## 2017-07-07 DIAGNOSIS — O4290 Premature rupture of membranes, unspecified as to length of time between rupture and onset of labor, unspecified weeks of gestation: Secondary | ICD-10-CM

## 2017-07-07 DIAGNOSIS — Z3480 Encounter for supervision of other normal pregnancy, unspecified trimester: Secondary | ICD-10-CM

## 2017-07-07 DIAGNOSIS — O99012 Anemia complicating pregnancy, second trimester: Secondary | ICD-10-CM

## 2017-07-07 DIAGNOSIS — R87612 Low grade squamous intraepithelial lesion on cytologic smear of cervix (LGSIL): Secondary | ICD-10-CM

## 2017-07-07 DIAGNOSIS — O99824 Streptococcus B carrier state complicating childbirth: Secondary | ICD-10-CM | POA: Diagnosis present

## 2017-07-07 DIAGNOSIS — O9912 Other diseases of the blood and blood-forming organs and certain disorders involving the immune mechanism complicating childbirth: Secondary | ICD-10-CM | POA: Diagnosis present

## 2017-07-07 DIAGNOSIS — Z3A38 38 weeks gestation of pregnancy: Secondary | ICD-10-CM

## 2017-07-07 DIAGNOSIS — O99820 Streptococcus B carrier state complicating pregnancy: Secondary | ICD-10-CM

## 2017-07-07 DIAGNOSIS — O9902 Anemia complicating childbirth: Secondary | ICD-10-CM | POA: Diagnosis present

## 2017-07-07 DIAGNOSIS — O4202 Full-term premature rupture of membranes, onset of labor within 24 hours of rupture: Principal | ICD-10-CM | POA: Diagnosis present

## 2017-07-07 DIAGNOSIS — O99119 Other diseases of the blood and blood-forming organs and certain disorders involving the immune mechanism complicating pregnancy, unspecified trimester: Secondary | ICD-10-CM

## 2017-07-07 DIAGNOSIS — D649 Anemia, unspecified: Secondary | ICD-10-CM | POA: Diagnosis present

## 2017-07-07 DIAGNOSIS — D696 Thrombocytopenia, unspecified: Secondary | ICD-10-CM

## 2017-07-07 MED ORDER — FENTANYL CITRATE (PF) 100 MCG/2ML IJ SOLN: 50.0000 ug | INTRAMUSCULAR | Status: DC | PRN

## 2017-07-07 NOTE — H&P (Signed)
LABOR ADMISSION HISTORY AND PHYSICAL  Michelle Neal is a 26 y.o. female G3P1011 with IUP at 4357w6d by US 12/29/16 presenting for SROM at 10:50pm 07/07/17. Endorses continuous leakage of clear fluids and pressure. She reports fetal movements. No contractions or vaginal bleeding. She plans on breast feeding. She request IUD or Nexplanon for birth control.  Dating: By [place] 12/29/16 --->  Estimated Date of Delivery: 07/15/17  Sono:    @[redacted]w[redacted]d , CWD, normal anatomy, breech presentation, placenta posterior above cervical os, 59% EFW   Prenatal History/Complications: GBS + Hx term SVD w/o problems Past Medical History: No past medical history on file.  Past Surgical History: No past surgical history on file.  Obstetrical History: OB History    Gravida Para Term Preterm AB Living   3 1 1  0 1 1   SAB TAB Ectopic Multiple Live Births   0 1 0 0 1      Social History: Social History   Social History  . Marital status: Single    Spouse name: N/A  . Number of children: N/A  . Years of education: N/A   Social History Main Topics  . Smoking status: Never Smoker  . Smokeless tobacco: Never Used  . Alcohol use Yes     Comment: Before pregnancy  . Drug use: No  . Sexual activity: Yes    Partners: Male    Birth control/ protection: None   Other Topics Concern  . None   Social History Narrative  . None    Family History: Family History  Problem Relation Age of Onset  . Hypertension Mother   . Breast cancer Maternal Aunt        40-50'S AGE OF ONSET    Allergies: No Known Allergies  Prescriptions Prior to Admission  Medication Sig Dispense Refill Last Dose  . acetaminophen (TYLENOL) 325 MG tablet Take 650 mg by mouth every 6 (six) hours as needed.   Taking  . Butalbital-APAP-Caffeine 50-325-40 MG capsule Take 1-2 capsules by mouth every 6 (six) hours as needed for headache. 30 capsule 3 Taking  . Cholecalciferol 50000 units TABS Take 1 tablet by mouth once a week.   Taking   . Dapsone (ACZONE) 7.5 % GEL APPLY TO THE FACE EVERY MORNING   Taking  . ferrous gluconate (FERGON) 324 MG tablet Take 1 tablet by mouth daily.   Taking  . Prenatal Vit-Fe Fumarate-FA (MULTIVITAMIN-PRENATAL) 27-0.8 MG TABS tablet Take 1 tablet by mouth daily at 12 noon.   Taking     Review of Systems   All systems reviewed and negative except as stated in HPI  Blood pressure 128/67, pulse 92, temperature 99.1 F (37.3 C), resp. rate 17, last menstrual period 10/15/2016. General appearance: alert, cooperative and no distress Presentation: cephalic Fetal monitoring: Baseline: 145 bpm, Variability: Good {> 6 bpm), Accelerations: Reactive and Decelerations: Absent Uterine activity: None  Dilation: 2 Exam by:: Cathie BeamsFran Cresenzo-Dishmon, CNM   Prenatal labs: ABO, Rh: B/Positive/-- (01/03 1052) Antibody: Negative (01/03 1052) Rubella: Immune RPR: Non Reactive (04/25 0851)  HBsAg: Negative (01/03 1052)  HIV: Non Reactive (04/25 0851)  GBS: Positive (06/20 1010)  1 hr Glucola: 98, normal Genetic screening: Normal Anatomy US: Normal anatomy  Prenatal Transfer Tool  Maternal Diabetes: No Genetic Screening: Normal Maternal Ultrasounds/Referrals: Normal Fetal Ultrasounds or other Referrals:  None Maternal Substance Abuse:  No Significant Maternal Medications:  None Significant Maternal Lab Results: Lab values include: Group B Strep positive  No results found for this or any  previous visit (from the past 24 hour(s)).  Patient Active Problem List   Diagnosis Date Noted  . GBS (group B Streptococcus carrier), +RV culture, currently pregnant 06/20/2017  . LGSIL on Pap smear of cervix 01/06/2017  . Supervision of normal pregnancy, antepartum 12/29/2016  . Anemia in pregnancy 12/29/2016  . Thrombocytopenia affecting pregnancy (HCC) 12/29/2016    Assessment: Michelle Neal is a 26 y.o. G3P1011 at [redacted]w[redacted]d here for SROM at 10:50pm 07/07/17.  #Labor: Expectant management, plan on Foley  bulb for induction, intrapartum antibiotics. Anticipate SVD #Pain: Epidural  #FWB: Cat 1 #ID: GBS positive #MOF: Breast #MOC: Nexplanon vs IUD #Circ: NA   CRESENZO-DISHMAN,Trammell Bowden

## 2017-07-07 NOTE — MAU Note (Signed)
Pt presents with possible ROM of clear fluid at 2215.  Reports good FM.  Irregular CTX off and on the past few days.  No VB or abnormal d/c.

## 2017-07-08 ENCOUNTER — Inpatient Hospital Stay (HOSPITAL_COMMUNITY): Payer: 59 | Admitting: Anesthesiology

## 2017-07-08 ENCOUNTER — Encounter (HOSPITAL_COMMUNITY): Payer: Self-pay

## 2017-07-08 DIAGNOSIS — Z3A38 38 weeks gestation of pregnancy: Secondary | ICD-10-CM | POA: Diagnosis not present

## 2017-07-08 DIAGNOSIS — D649 Anemia, unspecified: Secondary | ICD-10-CM | POA: Diagnosis present

## 2017-07-08 DIAGNOSIS — O4202 Full-term premature rupture of membranes, onset of labor within 24 hours of rupture: Secondary | ICD-10-CM | POA: Diagnosis present

## 2017-07-08 DIAGNOSIS — O99824 Streptococcus B carrier state complicating childbirth: Secondary | ICD-10-CM | POA: Diagnosis present

## 2017-07-08 DIAGNOSIS — D696 Thrombocytopenia, unspecified: Secondary | ICD-10-CM | POA: Diagnosis present

## 2017-07-08 DIAGNOSIS — O4290 Premature rupture of membranes, unspecified as to length of time between rupture and onset of labor, unspecified weeks of gestation: Secondary | ICD-10-CM | POA: Diagnosis present

## 2017-07-08 DIAGNOSIS — Z3A39 39 weeks gestation of pregnancy: Secondary | ICD-10-CM

## 2017-07-08 DIAGNOSIS — O9902 Anemia complicating childbirth: Secondary | ICD-10-CM | POA: Diagnosis present

## 2017-07-08 DIAGNOSIS — O9912 Other diseases of the blood and blood-forming organs and certain disorders involving the immune mechanism complicating childbirth: Secondary | ICD-10-CM | POA: Diagnosis present

## 2017-07-08 LAB — CBC
HCT: 30.8 % — ABNORMAL LOW (ref 36.0–46.0)
HEMOGLOBIN: 10.2 g/dL — AB (ref 12.0–15.0)
MCH: 31.2 pg (ref 26.0–34.0)
MCHC: 33.1 g/dL (ref 30.0–36.0)
MCV: 94.2 fL (ref 78.0–100.0)
Platelets: 124 10*3/uL — ABNORMAL LOW (ref 150–400)
RBC: 3.27 MIL/uL — ABNORMAL LOW (ref 3.87–5.11)
RDW: 13.8 % (ref 11.5–15.5)
WBC: 11.1 10*3/uL — ABNORMAL HIGH (ref 4.0–10.5)

## 2017-07-08 LAB — ABO/RH: ABO/RH(D): B POS

## 2017-07-08 LAB — TYPE AND SCREEN
ABO/RH(D): B POS
Antibody Screen: NEGATIVE

## 2017-07-08 LAB — RPR: RPR Ser Ql: NONREACTIVE

## 2017-07-08 MED ORDER — SENNOSIDES-DOCUSATE SODIUM 8.6-50 MG PO TABS
2.0000 | ORAL_TABLET | ORAL | Status: DC
  Administered 2017-07-08 – 2017-07-09 (×2): 2 via ORAL
  Filled 2017-07-08 (×2): qty 2

## 2017-07-08 MED ORDER — LACTATED RINGERS IV SOLN
500.0000 mL | Freq: Once | INTRAVENOUS | Status: AC
  Administered 2017-07-08: 500 mL via INTRAVENOUS

## 2017-07-08 MED ORDER — PENICILLIN G POTASSIUM 5000000 UNITS IJ SOLR
5.0000 10*6.[IU] | Freq: Once | INTRAVENOUS | Status: AC
  Administered 2017-07-08: 5 10*6.[IU] via INTRAVENOUS
  Filled 2017-07-08: qty 5

## 2017-07-08 MED ORDER — DIBUCAINE 1 % RE OINT: 1.0000 "application " | TOPICAL_OINTMENT | RECTAL | Status: DC | PRN

## 2017-07-08 MED ORDER — ACETAMINOPHEN 325 MG PO TABS
650.0000 mg | ORAL_TABLET | ORAL | Status: DC | PRN
  Administered 2017-07-08 – 2017-07-09 (×3): 650 mg via ORAL
  Filled 2017-07-08 (×3): qty 2

## 2017-07-08 MED ORDER — FENTANYL CITRATE (PF) 100 MCG/2ML IJ SOLN
INTRAMUSCULAR | Status: AC
  Filled 2017-07-08: qty 2

## 2017-07-08 MED ORDER — IBUPROFEN 600 MG PO TABS
600.0000 mg | ORAL_TABLET | Freq: Four times a day (QID) | ORAL | Status: DC
  Administered 2017-07-08 – 2017-07-10 (×8): 600 mg via ORAL
  Filled 2017-07-08 (×8): qty 1

## 2017-07-08 MED ORDER — TERBUTALINE SULFATE 1 MG/ML IJ SOLN
0.2500 mg | Freq: Once | INTRAMUSCULAR | Status: DC | PRN
  Filled 2017-07-08: qty 1

## 2017-07-08 MED ORDER — WITCH HAZEL-GLYCERIN EX PADS: 1.0000 "application " | MEDICATED_PAD | CUTANEOUS | Status: DC | PRN

## 2017-07-08 MED ORDER — ONDANSETRON HCL 4 MG/2ML IJ SOLN: 4.0000 mg | INTRAMUSCULAR | Status: DC | PRN

## 2017-07-08 MED ORDER — OXYTOCIN BOLUS FROM INFUSION
500.0000 mL | Freq: Once | INTRAVENOUS | Status: AC
  Administered 2017-07-08: 500 mL via INTRAVENOUS

## 2017-07-08 MED ORDER — SOD CITRATE-CITRIC ACID 500-334 MG/5ML PO SOLN: 30.0000 mL | ORAL | Status: DC | PRN

## 2017-07-08 MED ORDER — ONDANSETRON HCL 4 MG/2ML IJ SOLN: 4.0000 mg | Freq: Four times a day (QID) | INTRAMUSCULAR | Status: DC | PRN

## 2017-07-08 MED ORDER — DIPHENHYDRAMINE HCL 50 MG/ML IJ SOLN: 12.5000 mg | INTRAMUSCULAR | Status: DC | PRN

## 2017-07-08 MED ORDER — LIDOCAINE HCL (PF) 1 % IJ SOLN
INTRAMUSCULAR | Status: DC | PRN
  Administered 2017-07-08: 4 mL via EPIDURAL

## 2017-07-08 MED ORDER — LACTATED RINGERS IV SOLN: 500.0000 mL | INTRAVENOUS | Status: DC | PRN

## 2017-07-08 MED ORDER — LIDOCAINE-EPINEPHRINE (PF) 2 %-1:200000 IJ SOLN
INTRAMUSCULAR | Status: AC
  Filled 2017-07-08: qty 20

## 2017-07-08 MED ORDER — PHENYLEPHRINE 40 MCG/ML (10ML) SYRINGE FOR IV PUSH (FOR BLOOD PRESSURE SUPPORT)
80.0000 ug | PREFILLED_SYRINGE | INTRAVENOUS | Status: DC | PRN
  Filled 2017-07-08: qty 10
  Filled 2017-07-08: qty 5

## 2017-07-08 MED ORDER — LACTATED RINGERS IV SOLN: 500.0000 mL | Freq: Once | INTRAVENOUS | Status: DC

## 2017-07-08 MED ORDER — DIPHENHYDRAMINE HCL 25 MG PO CAPS: 25.0000 mg | ORAL_CAPSULE | Freq: Four times a day (QID) | ORAL | Status: DC | PRN

## 2017-07-08 MED ORDER — BENZOCAINE-MENTHOL 20-0.5 % EX AERO: 1.0000 "application " | INHALATION_SPRAY | CUTANEOUS | Status: DC | PRN

## 2017-07-08 MED ORDER — OXYTOCIN 40 UNITS IN LACTATED RINGERS INFUSION - SIMPLE MED
1.0000 m[IU]/min | INTRAVENOUS | Status: DC
Start: 2017-07-08 — End: 2017-07-08
  Administered 2017-07-08: 2 m[IU]/min via INTRAVENOUS

## 2017-07-08 MED ORDER — FENTANYL 2.5 MCG/ML BUPIVACAINE 1/10 % EPIDURAL INFUSION (WH - ANES)
14.0000 mL/h | INTRAMUSCULAR | Status: DC | PRN
  Administered 2017-07-08 (×2): 14 mL/h via EPIDURAL
  Filled 2017-07-08 (×2): qty 100

## 2017-07-08 MED ORDER — LIDOCAINE HCL (PF) 1 % IJ SOLN
30.0000 mL | INTRAMUSCULAR | Status: DC | PRN
  Filled 2017-07-08: qty 30

## 2017-07-08 MED ORDER — EPHEDRINE 5 MG/ML INJ
10.0000 mg | INTRAVENOUS | Status: DC | PRN
  Filled 2017-07-08: qty 2

## 2017-07-08 MED ORDER — LACTATED RINGERS IV SOLN
INTRAVENOUS | Status: DC
  Administered 2017-07-08: 01:00:00 via INTRAVENOUS

## 2017-07-08 MED ORDER — FENTANYL CITRATE (PF) 100 MCG/2ML IJ SOLN: 50.0000 ug | INTRAMUSCULAR | Status: DC | PRN

## 2017-07-08 MED ORDER — HYDROXYZINE HCL 25 MG PO TABS
25.0000 mg | ORAL_TABLET | Freq: Three times a day (TID) | ORAL | Status: DC | PRN
  Administered 2017-07-08: 25 mg via ORAL
  Filled 2017-07-08 (×2): qty 1

## 2017-07-08 MED ORDER — OXYCODONE-ACETAMINOPHEN 5-325 MG PO TABS
1.0000 | ORAL_TABLET | Freq: Once | ORAL | Status: AC
  Administered 2017-07-09: 1 via ORAL
  Filled 2017-07-08: qty 1

## 2017-07-08 MED ORDER — PHENYLEPHRINE 40 MCG/ML (10ML) SYRINGE FOR IV PUSH (FOR BLOOD PRESSURE SUPPORT)
80.0000 ug | PREFILLED_SYRINGE | INTRAVENOUS | Status: DC | PRN
  Filled 2017-07-08: qty 5

## 2017-07-08 MED ORDER — OXYTOCIN 40 UNITS IN LACTATED RINGERS INFUSION - SIMPLE MED
2.5000 [IU]/h | INTRAVENOUS | Status: DC
  Administered 2017-07-08: 2.5 [IU]/h via INTRAVENOUS

## 2017-07-08 MED ORDER — OXYCODONE-ACETAMINOPHEN 5-325 MG PO TABS: 2.0000 | ORAL_TABLET | ORAL | Status: DC | PRN

## 2017-07-08 MED ORDER — FLEET ENEMA 7-19 GM/118ML RE ENEM: 1.0000 | ENEMA | RECTAL | Status: DC | PRN

## 2017-07-08 MED ORDER — TETANUS-DIPHTH-ACELL PERTUSSIS 5-2.5-18.5 LF-MCG/0.5 IM SUSP: 0.5000 mL | Freq: Once | INTRAMUSCULAR | Status: DC

## 2017-07-08 MED ORDER — PRENATAL MULTIVITAMIN CH
1.0000 | ORAL_TABLET | Freq: Every day | ORAL | Status: DC
  Administered 2017-07-09: 1 via ORAL
  Filled 2017-07-08: qty 1

## 2017-07-08 MED ORDER — SODIUM BICARBONATE 8.4 % IV SOLN
INTRAVENOUS | Status: DC | PRN
  Administered 2017-07-08 (×2): 5 mL via EPIDURAL

## 2017-07-08 MED ORDER — PENICILLIN G POT IN DEXTROSE 60000 UNIT/ML IV SOLN
3.0000 10*6.[IU] | INTRAVENOUS | Status: DC
  Administered 2017-07-08: 3 10*6.[IU] via INTRAVENOUS
  Filled 2017-07-08 (×3): qty 50

## 2017-07-08 MED ORDER — SIMETHICONE 80 MG PO CHEW: 80.0000 mg | CHEWABLE_TABLET | ORAL | Status: DC | PRN

## 2017-07-08 MED ORDER — ZOLPIDEM TARTRATE 5 MG PO TABS: 5.0000 mg | ORAL_TABLET | Freq: Every evening | ORAL | Status: DC | PRN

## 2017-07-08 MED ORDER — COCONUT OIL OIL: 1.0000 "application " | TOPICAL_OIL | Status: DC | PRN

## 2017-07-08 MED ORDER — OXYCODONE-ACETAMINOPHEN 5-325 MG PO TABS: 1.0000 | ORAL_TABLET | ORAL | Status: DC | PRN

## 2017-07-08 MED ORDER — ACETAMINOPHEN 325 MG PO TABS: 650.0000 mg | ORAL_TABLET | ORAL | Status: DC | PRN

## 2017-07-08 MED ORDER — ONDANSETRON HCL 4 MG PO TABS: 4.0000 mg | ORAL_TABLET | ORAL | Status: DC | PRN

## 2017-07-08 MED ORDER — OXYTOCIN 40 UNITS IN LACTATED RINGERS INFUSION - SIMPLE MED
1.0000 m[IU]/min | INTRAVENOUS | Status: DC
  Filled 2017-07-08: qty 1000

## 2017-07-08 NOTE — Anesthesia Pain Management Evaluation Note (Signed)
  CRNA Pain Management Visit Note  Patient: Michelle Neal, 26 y.o., female  "Hello I am a [facility] of the anesthesia [facility]. We have an anesthesia team available at all times to provide care throughout the hospital, including epidural management and anesthesia for C-section. I don't know your plan for the delivery whether it a natural birth, water birth, IV sedation, nitrous supplementation, doula or epidural, but we want to meet your pain goals."   1.Was your pain managed to your expectations on prior hospitalizations?   Yes   2.What is your expectation for pain management during this hospitalization?     Epidural  3.How can we help you reach that goal? Epidual  Record the patient's initial score and the patient's pain goal.   Pain: 4  Pain Goal: 6 [facility] wants you to be able to say your pain was always managed very well.  Rica RecordsICKELTON,Michelle Neal 07/08/2017

## 2017-07-08 NOTE — Lactation Note (Signed)
This note was copied from a baby's chart. Lactation Consultation Note  Patient Name: Girl Vance GatherJayonna Marseille ZOXWR'UToday's Date: 07/08/2017 Reason for consult: Initial assessment Baby at 20 minutes of life. Mom seems unsure about bf. She would like to bf but will "mostly formula feed". She is not interested in pumping to feed. Discussed baby behavior, feeding frequency, baby belly size, voids, wt loss, breast changes, and nipple care. Demonstrated manual expression, colostrum noted. Given lactation handouts. Aware of OP services and support group.     Maternal Data Has patient been taught Hand Expression?: Yes  Feeding Feeding Type: Breast Fed Length of feed: 5 min  LATCH Score/Interventions Latch: Repeated attempts needed to sustain latch, nipple held in mouth throughout feeding, stimulation needed to elicit sucking reflex. Intervention(s): Adjust position;Assist with latch;Breast massage;Breast compression  Audible Swallowing: None Intervention(s): Skin to skin;Hand expression  Type of Nipple: Everted at rest and after stimulation  Comfort (Breast/Nipple): Soft / non-tender     Hold (Positioning): Full assist, staff holds infant at breast Intervention(s): Breastfeeding basics reviewed;Skin to skin  LATCH Score: 5  Lactation Tools Discussed/Used     Consult Status Consult Status: Follow-up Date: 07/09/17 Follow-up type: In-patient    Rulon Eisenmengerlizabeth E Courtland Coppa 07/08/2017, 11:05 AM

## 2017-07-08 NOTE — Anesthesia Preprocedure Evaluation (Signed)
Anesthesia Evaluation  Patient identified by MRN, date of birth, ID band Patient awake    Reviewed: Allergy & Precautions, Patient's Chart, lab work & pertinent test results  Airway Mallampati: II       Dental no notable dental hx.    Pulmonary    Pulmonary exam normal        Cardiovascular negative cardio ROS Normal cardiovascular exam     Neuro/Psych    GI/Hepatic   Endo/Other    Renal/GU      Musculoskeletal   Abdominal   Peds  Hematology   Anesthesia Other Findings   Reproductive/Obstetrics (+) Pregnancy                             Lab Results  Component Value Date   WBC 11.1 (H) 07/07/2017   HGB 10.2 (L) 07/07/2017   HCT 30.8 (L) 07/07/2017   MCV 94.2 07/07/2017   PLT 124 (L) 07/07/2017     Anesthesia Physical Anesthesia Plan  ASA: II  Anesthesia Plan: Epidural   Post-op Pain Management:    Induction:   PONV Risk Score and Plan:   Airway Management Planned:   Additional Equipment:   Intra-op Plan:   Post-operative Plan:   Informed Consent: I have reviewed the patients History and Physical, chart, labs and discussed the procedure including the risks, benefits and alternatives for the proposed anesthesia with the patient or authorized representative who has indicated his/her understanding and acceptance.     Plan Discussed with:   Anesthesia Plan Comments:         Anesthesia Quick Evaluation

## 2017-07-08 NOTE — Anesthesia Procedure Notes (Signed)
Epidural Patient location during procedure: OB Start time: 07/08/2017 2:30 AM End time: 07/08/2017 2:37 AM  Staffing Anesthesiologist: Shona SimpsonHOLLIS, Michelle Birchler D Performed: anesthesiologist   Preanesthetic Checklist Completed: patient identified, site marked, surgical consent, pre-op evaluation, timeout performed, IV checked, risks and benefits discussed and monitors and equipment checked  Epidural Patient position: sitting Prep: ChloraPrep Patient monitoring: heart rate, continuous pulse ox and blood pressure Approach: midline Location: L3-L4 Injection technique: LOR saline  Needle:  Needle type: Tuohy  Needle gauge: 17 G Needle length: 9 cm Catheter type: closed end flexible Catheter size: 20 Guage Test dose: negative and 1.5% lidocaine  Assessment Events: blood not aspirated, injection not painful, no injection resistance and no paresthesia  Additional Notes LOR @ 5.5  Patient identified. Risks/Benefits/Options discussed with patient including but not limited to bleeding, infection, nerve damage, paralysis, failed block, incomplete pain control, headache, blood pressure changes, nausea, vomiting, reactions to medications, itching and postpartum back pain. Confirmed with bedside nurse the patient's most recent platelet count. Confirmed with patient that they are not currently taking any anticoagulation, have any bleeding history or any family history of bleeding disorders. Patient expressed understanding and wished to proceed. All questions were answered. Sterile technique was used throughout the entire procedure. Please see nursing notes for vital signs. Test dose was given through epidural catheter and negative prior to continuing to dose epidural or start infusion. Warning signs of high block given to the patient including shortness of breath, tingling/numbness in hands, complete motor block, or any concerning symptoms with instructions to call for help. Patient was given instructions on  fall risk and not to get out of bed. All questions and concerns addressed with instructions to call with any issues or inadequate analgesia.    Reason for block:procedure for pain

## 2017-07-08 NOTE — Progress Notes (Signed)
Foley inserted and inflated w/60cc H20.

## 2017-07-08 NOTE — Anesthesia Postprocedure Evaluation (Signed)
Anesthesia Post Note  Patient: Michelle Neal  Procedure(s) Performed: * No procedures listed *     Patient location during evaluation: Mother Baby Anesthesia Type: Epidural Level of consciousness: awake Pain management: satisfactory to patient Vital Signs Assessment: post-procedure vital signs reviewed and stable Respiratory status: spontaneous breathing Cardiovascular status: stable Anesthetic complications: no    Last Vitals:  Vitals:   07/08/17 1322 07/08/17 1706  BP: 120/71 110/67  Pulse: (!) 59 77  Resp: 17 18  Temp: 37 C 37.3 C    Last Pain:  Vitals:   07/08/17 1900  TempSrc:   PainSc: 0-No pain   Pain Goal: Patients Stated Pain Goal: 3 (07/08/17 1323)               Cephus ShellingBURGER,Halvor Behrend

## 2017-07-09 MED ORDER — OXYCODONE-ACETAMINOPHEN 5-325 MG PO TABS
1.0000 | ORAL_TABLET | Freq: Four times a day (QID) | ORAL | Status: DC | PRN
  Administered 2017-07-09 (×2): 1 via ORAL
  Filled 2017-07-09 (×2): qty 1

## 2017-07-09 NOTE — Progress Notes (Signed)
POSTPARTUM PROGRESS NOTE  Post Partum Day #1  Subjective:  Michelle Neal is a 26 y.o. [id number] 2379w0d s/p SVD.  No acute events overnight.  Pt denies problems with ambulating, voiding or po intake.  She denies nausea or vomiting.  Pain is well controlled.  She has had flatus. She has not had bowel movement.  Lochia Minimal.   Objective: Blood pressure 109/67, pulse 71, temperature 97.9 F (36.6 C), temperature source Oral, resp. rate 20, height 5\' 4"  (1.626 m), weight 83.9 kg (185 lb), last menstrual period 10/15/2016, SpO2 99 %, unknown if currently breastfeeding.  Physical Exam:  General: alert, cooperative and no distress Lochia:normal flow Uterine Fundus: firm, below umbilicus DVT Evaluation: No calf swelling or tenderness Extremities: no LE edema   Recent Labs  07/07/17 2355  HGB 10.2*  HCT 30.8*    Assessment/Plan:  ASSESSMENT: Michelle GatherJayonna Mcbrearty is a 26 y.o. [id number] 5779w0d s/p SVD  Plan for discharge tomorrow   LOS: 1 day   Howard PouchLauren Feng, MD PGY-2 [facility]  07/09/2017, 7:22 AM   OB FELLOW POSTPARTUM PROGRESS NOTE ATTESTATION  I have seen and examined this patient and agree with above documentation in the resident's note.   Frederik PearJulie P Degele, MD OB Fellow 3:48 PM

## 2017-07-10 MED ORDER — IBUPROFEN 600 MG PO TABS: 600.0000 mg | ORAL_TABLET | Freq: Four times a day (QID) | ORAL | 0 refills | Status: DC

## 2017-07-10 NOTE — Discharge Instructions (Signed)

## 2017-07-10 NOTE — Discharge Summary (Signed)
OB Discharge Summary     Patient Name: Michelle GatherJayonna Hagans DOB: [birth year] MRN: [id number]  Date of admission: 07/07/2017 Delivering MD: Sharen CounterLEFTWICH-KIRBY, LISA A   Date of discharge: 07/10/2017  Admitting diagnosis: 39 WKS WATER BROKE PRESSURE AND BACK PAIN Intrauterine pregnancy: 1864w0d     Secondary diagnosis:  Active Problems:   PROM (premature rupture of membranes)   NSVD (normal spontaneous vaginal delivery)  Additional problems: none     Discharge diagnosis: Term Pregnancy Delivered                                                                                                Post partum procedures:none  Augmentation: Foley Balloon  Complications: None  Hospital course:  Onset of Labor With Vaginal Delivery     26 y.o. yo [id number] at 3264w0d was admitted in Latent Labor on 07/07/2017. Patient had an uncomplicated labor course as follows:  Membrane Rupture Time/Date: 10:15 PM ,07/07/2017   Intrapartum Procedures: Episiotomy: None [1]                                         Lacerations:  1st degree [2]  Patient had a delivery of a Viable infant. 07/08/2017  Information for the patient's newborn:  Oletta CohnCrews, Girl Karly [[id number]][[id number]]  Delivery Method: Vag-Spont    Pateint had an uncomplicated postpartum course.  She is ambulating, tolerating a regular diet, passing flatus, and urinating well. Patient is discharged home in stable condition on 07/10/17.   Physical exam  Vitals:   07/08/17 2230 07/09/17 0519 07/09/17 1711 07/10/17 0610  BP: 111/62 109/67 124/77 115/75  Pulse: 66 71 78 75  Resp: 18 20 19 20   Temp: 98.7 F (37.1 C) 97.9 F (36.6 C) 98.5 F (36.9 C) 98.2 F (36.8 C)  TempSrc: Oral Oral Oral Oral  SpO2:  99% 100%   Weight:      Height:       General: alert, cooperative and no distress Lochia: appropriate Uterine Fundus: firm Incision: Healing well with no significant drainage DVT Evaluation: No evidence of DVT seen on physical exam. Labs: Lab Results   Component Value Date   WBC 11.1 (H) 07/07/2017   HGB 10.2 (L) 07/07/2017   HCT 30.8 (L) 07/07/2017   MCV 94.2 07/07/2017   PLT 124 (L) 07/07/2017   CMP Latest Ref Rng & Units 12/24/2015  Glucose 65 - 99 mg/dL 83  BUN 6 - 20 mg/dL 11  Creatinine 1.300.44 - 8.651.00 mg/dL 7.840.88  Sodium 696135 - 295145 mmol/L 135  Potassium 3.5 - 5.1 mmol/L 3.8  Chloride 101 - 111 mmol/L 101  CO2 22 - 32 mmol/L 27  Calcium 8.9 - 10.3 mg/dL 9.0  Total Protein 6.5 - 8.1 g/dL 8.1  Total Bilirubin 0.3 - 1.2 mg/dL 0.4  Alkaline Phos 38 - 126 U/L 34(L)  AST 15 - 41 U/L 22  ALT 14 - 54 U/L 15    Discharge instruction: per After Visit Summary and "Baby and Me Booklet".  After  visit meds:  Allergies as of 07/10/2017   No Known Allergies     Medication List    TAKE these medications   ACZONE 7.5 % Gel Generic drug:  Dapsone APPLY TO THE FACE EVERY MORNING   Butalbital-APAP-Caffeine 50-325-40 MG capsule Take 1-2 capsules by mouth every 6 (six) hours as needed for headache.   Cholecalciferol 50000 units Tabs Take 1 tablet by mouth once a week.   ferrous gluconate 324 MG tablet Commonly known as:  FERGON Take 1 tablet by mouth daily.   ibuprofen 600 MG tablet Commonly known as:  ADVIL,MOTRIN Take 1 tablet (600 mg total) by mouth every 6 (six) hours.   multivitamin-prenatal 27-0.8 MG Tabs tablet Take 1 tablet by mouth daily at 12 noon.       Diet: routine diet  Activity: Advance as tolerated. Pelvic rest for 6 weeks.   Outpatient follow up:4 weeks Follow up Appt:Future Appointments Date Time Provider Department Center  07/11/2017 1:30 PM Pace Bing, MD [facility]   Follow up Visit:No Follow-up on file.  Postpartum contraception: Undecided  Newborn Data: Live born female  Birth Weight: 7 lb 1.8 oz (3225 g) APGAR: 8, 9  Baby Feeding: Breast Disposition:home with mother   07/10/2017 Wynelle Bourgeois, CNM

## 2017-07-11 ENCOUNTER — Encounter: Payer: 59 | Admitting: Obstetrics and Gynecology

## 2017-07-11 NOTE — Progress Notes (Signed)
Post discharge chart review completed.  

## 2017-08-12 ENCOUNTER — Encounter: Payer: Self-pay | Admitting: Obstetrics & Gynecology

## 2017-08-12 ENCOUNTER — Ambulatory Visit (INDEPENDENT_AMBULATORY_CARE_PROVIDER_SITE_OTHER): Payer: 59 | Admitting: Obstetrics & Gynecology

## 2017-08-12 VITALS — BP 122/86 | HR 73 | Wt 173.0 lb

## 2017-08-12 DIAGNOSIS — Z0000 Encounter for general adult medical examination without abnormal findings: Secondary | ICD-10-CM

## 2017-08-12 DIAGNOSIS — Z124 Encounter for screening for malignant neoplasm of cervix: Secondary | ICD-10-CM | POA: Diagnosis not present

## 2017-08-12 DIAGNOSIS — Z1151 Encounter for screening for human papillomavirus (HPV): Secondary | ICD-10-CM

## 2017-08-12 DIAGNOSIS — Z30017 Encounter for initial prescription of implantable subdermal contraceptive: Secondary | ICD-10-CM

## 2017-08-12 MED ORDER — ETONOGESTREL 68 MG ~~LOC~~ IMPL
68.0000 mg | DRUG_IMPLANT | Freq: Once | SUBCUTANEOUS | Status: AC
  Administered 2017-08-12: 68 mg via SUBCUTANEOUS

## 2017-08-12 NOTE — Progress Notes (Signed)
Post Partum Exam  Michelle Neal is a 26 y.o. G9P2012 female who presents for a postpartum visit. She is 4 weeks postpartum following a spontaneous vaginal delivery. I have fully reviewed the prenatal and intrapartum course. The delivery was at 39.0 gestational weeks.  Anesthesia: epidural. Postpartum course has been unremarkable. Baby's course has been unremarkable. Baby is feeding by both breast and bottle - Similac Advance. Bleeding no bleeding. Bowel function is normal. Bladder function is normal. Patient is sexually active. Contraception method is Nexplanon. Postpartum depression screening:neg  The following portions of the patient's history were reviewed and updated as appropriate: allergies, current medications, past family history, past medical history, past social history, past surgical history and problem list.  Review of Systems Pertinent items are noted in HPI.    Objective:  unknown if currently breastfeeding.  General:  alert   Breasts:  inspection negative, no nipple discharge or bleeding, no masses or nodularity palpable  Lungs: clear to auscultation bilaterally  Heart:  regular rate and rhythm, S1, S2 normal, no murmur, click, rub or gallop  Abdomen: soft, non-tender; bowel sounds normal; no masses,  no organomegaly   Vulva:  normal  Vagina: normal vagina  Cervix:  anteverted  Corpus: normal size, contour, position, consistency, mobility, non-tender  Adnexa:  normal adnexa  Rectal Exam: Not performed.       UPT was negative. Consent was signed. Time out procedure was done. Her left arm was prepped with betadine and infiltrated with 3 cc of 1% lidocaine. After adequate anesthesia was assured, the Nexplanon device was placed according to standard of care. Her arm was hemostatic and was bandaged. She tolerated the procedure well.  Assessment:    Normal postpartum exam. Pap smear done at today's visit.   Plan:   1. Contraception: Nexplanon 2. Pap smear 3. Follow up  in: 1 year or as needed.

## 2017-08-12 NOTE — Addendum Note (Signed)
Addended by: Arne Cleveland on: 08/12/2017 10:23 AM   Modules accepted: Orders

## 2017-08-17 LAB — CYTOLOGY - PAP
DIAGNOSIS: NEGATIVE
HPV: NOT DETECTED

## 2017-10-06 ENCOUNTER — Telehealth: Payer: Self-pay | Admitting: *Deleted

## 2017-10-06 DIAGNOSIS — N939 Abnormal uterine and vaginal bleeding, unspecified: Secondary | ICD-10-CM

## 2017-10-06 MED ORDER — NORGESTREL-ETHINYL ESTRADIOL 0.3-30 MG-MCG PO TABS: 1.0000 | ORAL_TABLET | Freq: Every day | ORAL | 11 refills | Status: DC

## 2017-10-06 NOTE — Telephone Encounter (Signed)
Pt called in stating she has been bleeding since Nexplanon insert. Per Dr Marice Potter sent OCP to have pt take to try to stop bleeding.

## 2017-10-06 NOTE — Telephone Encounter (Signed)
LM for pt to call back.

## 2017-10-06 NOTE — Telephone Encounter (Signed)
-----   Message from Lindell Spar, [place] sent at 10/05/2017 10:46 AM EDT ----- Regarding: has nextplanon and has a question about pills to stop cycle Contact: [phone] Please call lavilla, she is at work leave message and she will call back

## 2017-11-04 ENCOUNTER — Ambulatory Visit (INDEPENDENT_AMBULATORY_CARE_PROVIDER_SITE_OTHER): Payer: 59 | Admitting: Obstetrics and Gynecology

## 2017-11-04 VITALS — BP 108/71 | HR 68 | Wt 173.1 lb

## 2017-11-04 DIAGNOSIS — Z978 Presence of other specified devices: Secondary | ICD-10-CM

## 2017-11-04 DIAGNOSIS — Z3046 Encounter for surveillance of implantable subdermal contraceptive: Secondary | ICD-10-CM

## 2017-11-04 DIAGNOSIS — N921 Excessive and frequent menstruation with irregular cycle: Secondary | ICD-10-CM

## 2017-11-04 DIAGNOSIS — Z975 Presence of (intrauterine) contraceptive device: Secondary | ICD-10-CM

## 2017-11-04 DIAGNOSIS — Z3041 Encounter for surveillance of contraceptive pills: Secondary | ICD-10-CM

## 2017-11-04 NOTE — Progress Notes (Signed)
Obstetrics and Gynecology Established Patient Evaluation  Appointment Date: 11/04/2017  [facility]  Primary Care Provider: Patient, No Pcp Per  Referring Provider: No ref. provider found  Chief Complaint:  Chief Complaint  Patient presents with  . nexplanon removal    History of Present Illness: Vance GatherJayonna Dicenso is a 26 y.o. African-American [id number] (No LMP recorded.), seen for the above chief complaint. Patient had nexplanon placed at her PP visit 08/12/2017 and has had BTB/AUB every since. She did a month of OCPs but didn't seem to help. Pap smear negative this year   Review of Systems: as noted in the History of Present Illness.  Past Medical History:  No past medical history on file.  Past Surgical History:  No past surgical history on file.  Past Obstetrical History:  OB History  Gravida Para Term Preterm AB Living  3 2 2  0 1 2  SAB TAB Ectopic Multiple Live Births  0 1 0 0 2    # Outcome Date GA Lbr Len/2nd Weight Sex Delivery Anes PTL Lv  3 Term 07/08/17 7566w0d 11:50 / 00:07 7 lb 1.8 oz (3.225 kg) F Vag-Spont EPI  LIV  2 TAB 08/13/16 6749w4d    TAB     1 Term 01/18/10 6761w0d   F Vag-Spont   LIV      Past Gynecological History: As per HPI.  Social History:  Social History   Socioeconomic History  . Marital status: Single    Spouse name: Not on file  . Number of children: Not on file  . Years of education: Not on file  . Highest education level: Not on file  Social Needs  . Financial resource strain: Not on file  . Food insecurity - worry: Not on file  . Food insecurity - inability: Not on file  . Transportation needs - medical: Not on file  . Transportation needs - non-medical: Not on file  Occupational History  . Not on file  Tobacco Use  . Smoking status: Never Smoker  . Smokeless tobacco: Never Used  Substance and Sexual Activity  . Alcohol use: Yes    Comment: Before pregnancy  . Drug use: No  . Sexual  activity: Yes    Partners: Male    Birth control/protection: None  Other Topics Concern  . Not on file  Social History Narrative  . Not on file    Family History:  Family History  Problem Relation Age of Onset  . Hypertension Mother   . Breast cancer Maternal Aunt        40-50'S AGE OF ONSET     Medications Vance GatherJayonna Carsten had no medications administered during this visit. Current Outpatient Medications  Medication Sig Dispense Refill  . Butalbital-APAP-Caffeine 50-325-40 MG capsule Take 1-2 capsules by mouth every 6 (six) hours as needed for headache. (Patient not taking: Reported on 11/04/2017) 30 capsule 3  . Cholecalciferol 50000 units TABS Take 1 tablet by mouth once a week.    . Dapsone (ACZONE) 7.5 % GEL APPLY TO THE FACE EVERY MORNING    . ferrous gluconate (FERGON) 324 MG tablet Take 1 tablet by mouth daily.    Marland Kitchen. ibuprofen (ADVIL,MOTRIN) 600 MG tablet Take 1 tablet (600 mg total) by mouth every 6 (six) hours. (Patient not taking: Reported on 11/04/2017) 30 tablet 0  . Prenatal Vit-Fe Fumarate-FA (MULTIVITAMIN-PRENATAL) 27-0.8 MG TABS tablet Take 1 tablet by mouth daily at 12 noon.     No current facility-administered  medications for this visit.     Allergies Patient has no known allergies.   Physical Exam:  BP 108/71   Pulse 68   Wt 173 lb 1.6 oz (78.5 kg)   BMI 29.71 kg/m   Body mass index is 29.71 kg/m. General appearance: Well nourished, well developed female in no acute distress.    Laboratory: none  Radiology: none  Assessment: pt stable  Plan:  1. Nexplanon removal Nexplanon removed w/o issue  She states that she has migraines qday and she has aura with it. Given this, I told her I don't recommend the regular BC pill or any estrogen containing options, given risk of stroke. Other options d/w her and she will think about what she'd like to do moving forward.   RTC PRN  Cornelia Copaharlie Doral Ventrella, Jr MD Attending [facility] and exercise psychologist(Faculty  Practice)

## 2017-11-04 NOTE — Procedures (Signed)
Nexplanon Removal Procedure Note Prior to the procedure being performed, the patient (or guardian) was asked to state their full name, date of birth, type of procedure being performed and the exact location of the operative site. This information was then checked against the documentation in the patient's chart. Prior to the procedure being performed, a "time out" was performed by the physician that confirmed the correct patient, procedure and site.  After informed consent was obtained, the patient's left arm was examined and the Nexplanon palpated at the prior insertion site. The area was cleaned with alcohol then local anesthesia was infiltrated with 3 ml of 1% lidocaine with epinephrine along the planned insertion track. An 11 blade was used to incise the old incision and the nexplanon brought to the site, the capsule scrapped off and the nexplanon removed with hemostat.  Pressure was applied and the insertion site was hemostatic. A pressure dressing was applied.  The patient tolerated the procedure well.  Cornelia Copaharlie Tarick Parenteau, Jr MD Attending [facility](Faculty Practice)

## 2018-01-05 IMAGING — US US TRANSVAGINAL NON-OB
1 series · 13 of 25 positions shown · non-contrast
Comparison: None

CLINICAL DATA: Vaginal bleeding. Status post medical abortion 5
days ago. Assess for retained products of conception. Beta HCG
[DATE].



[Series 1: us transvaginal non-ob · 0.20mm/px · 13 of 101 slices shown]
[im 1/101]
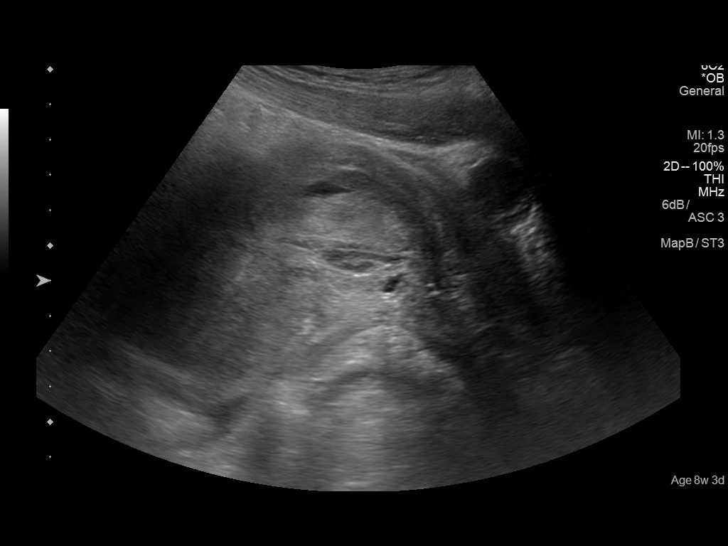
[im 9/101]
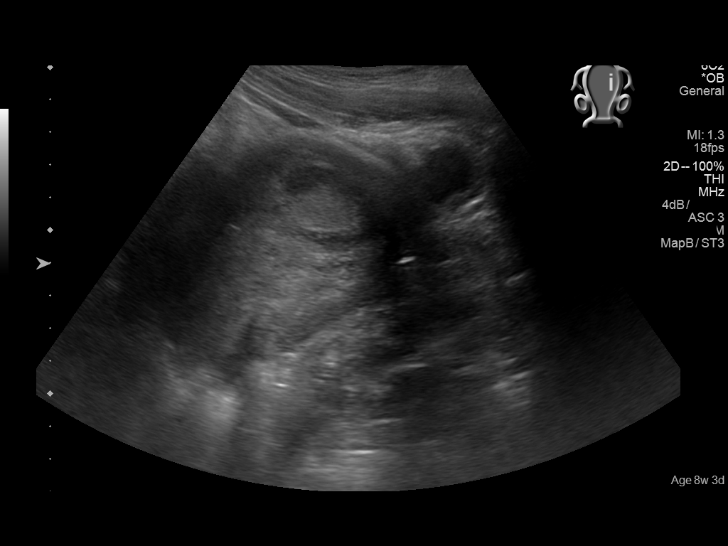
[im 17/101]
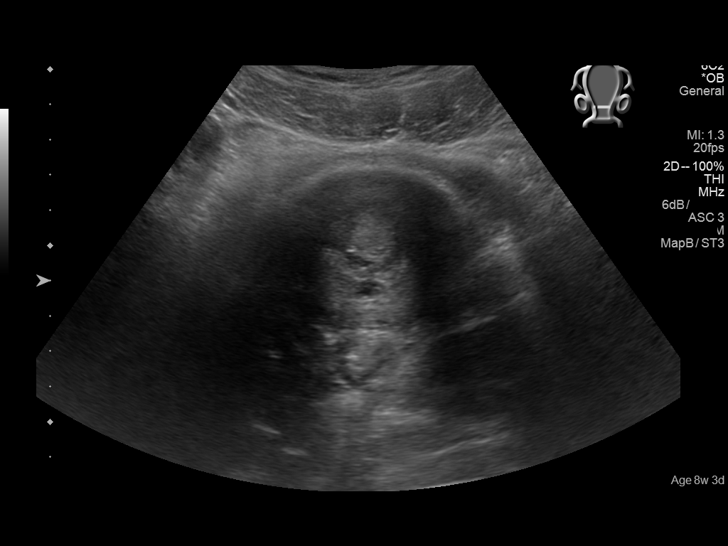
[im 26/101]
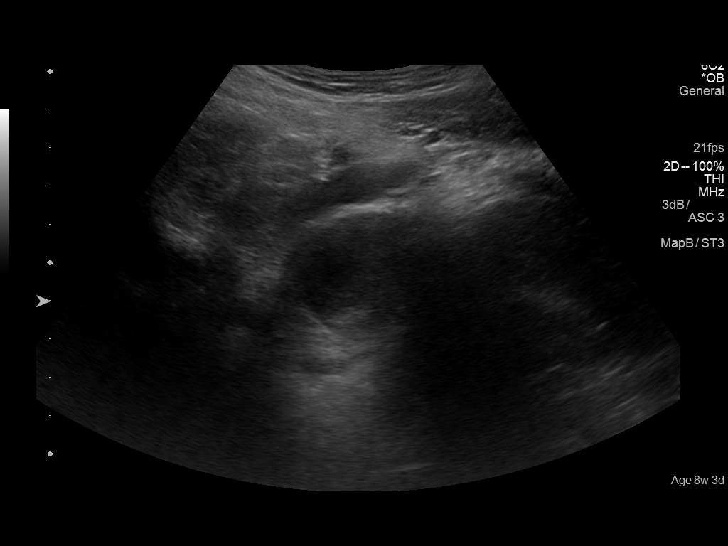
[im 34/101]
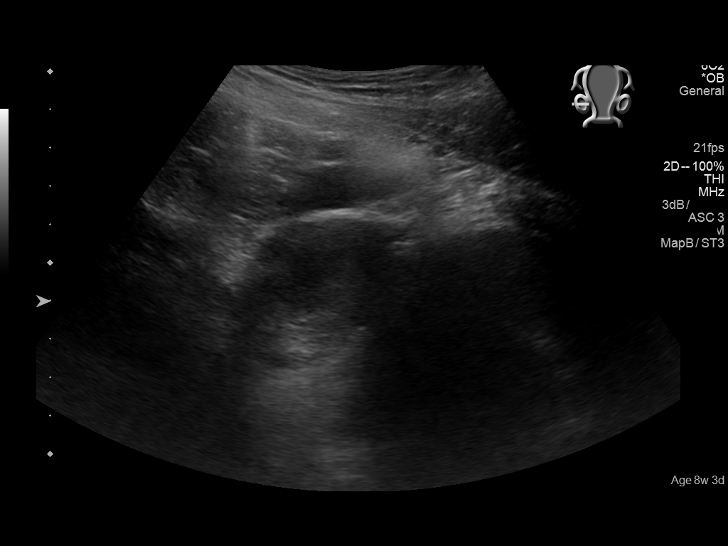
[im 42/101]
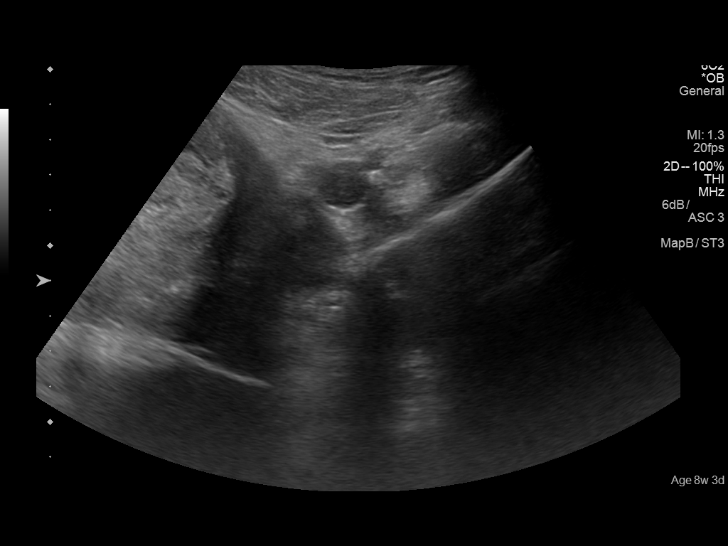
[im 51/101]
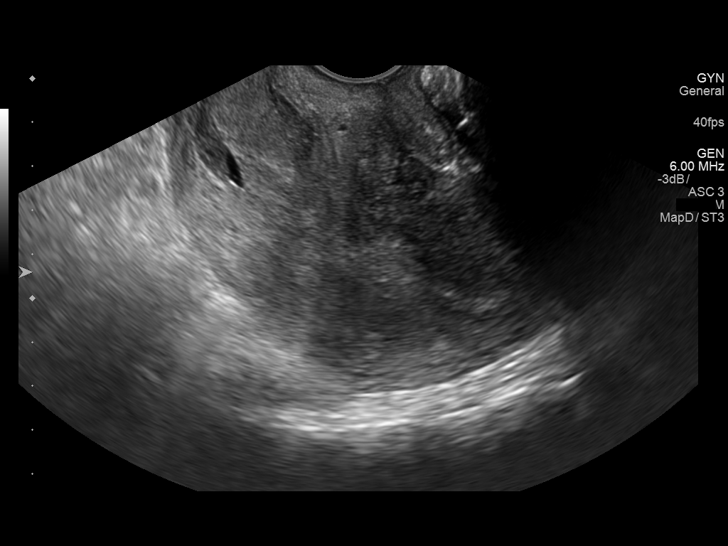
[im 59/101]
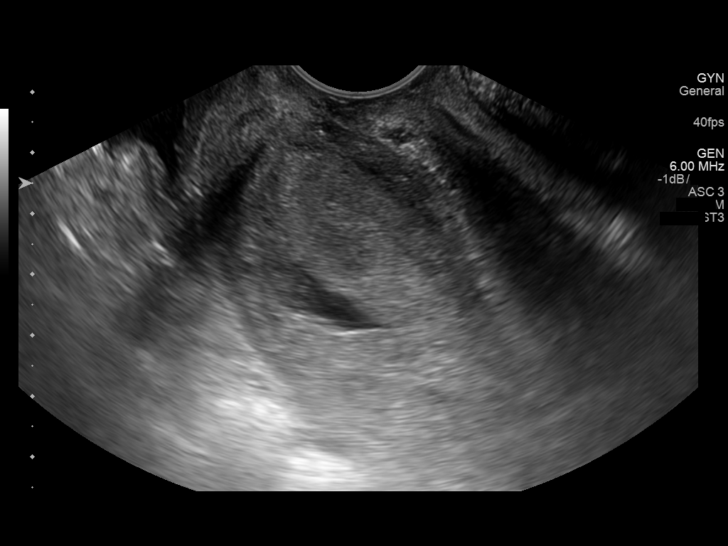
[im 67/101]
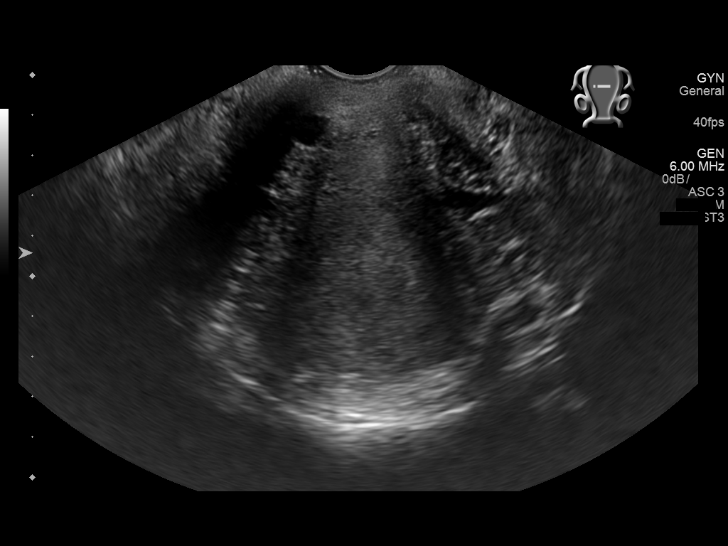
[im 76/101]
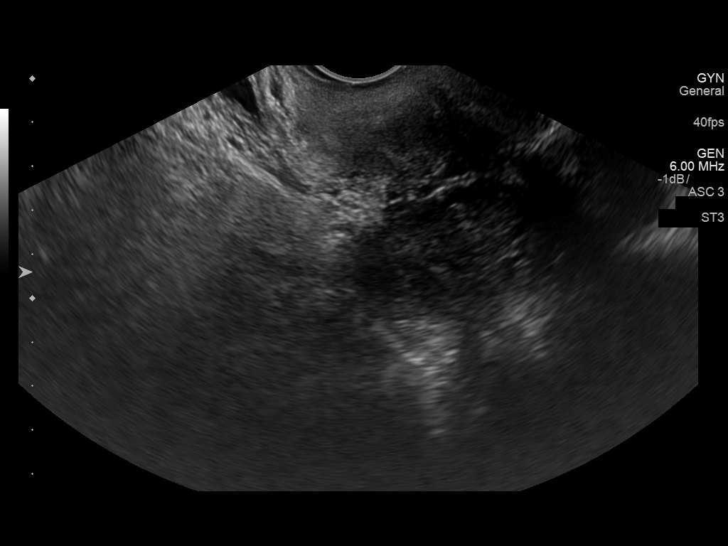
[im 84/101]
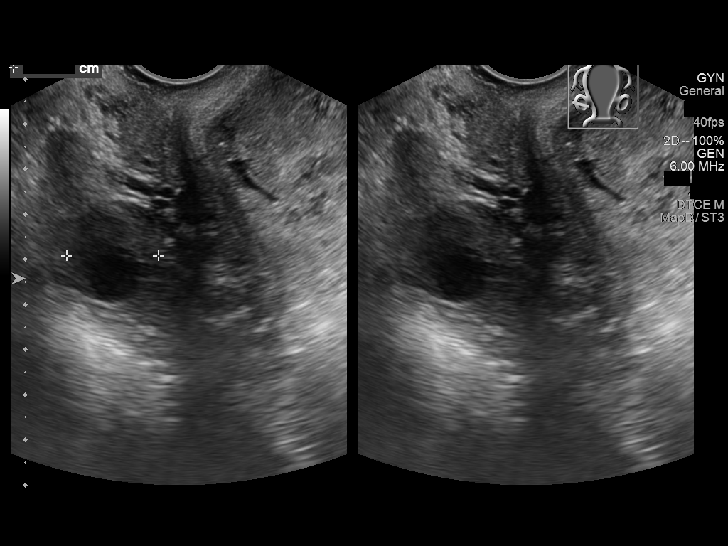
[im 92/101]
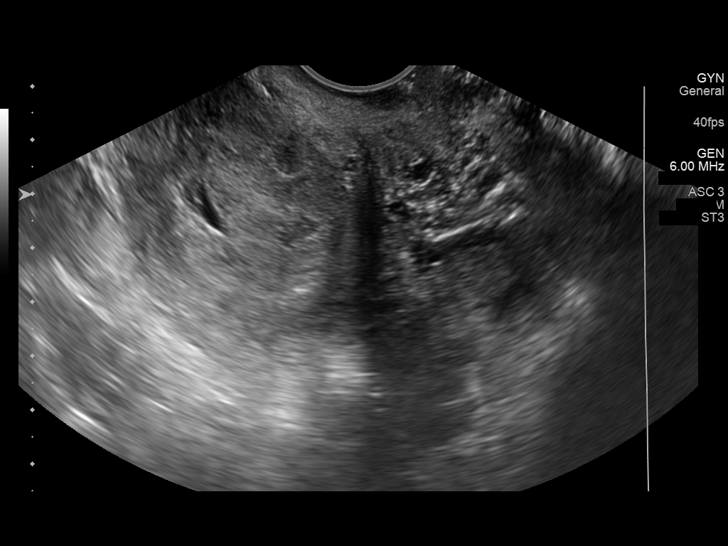
[im 101/101]
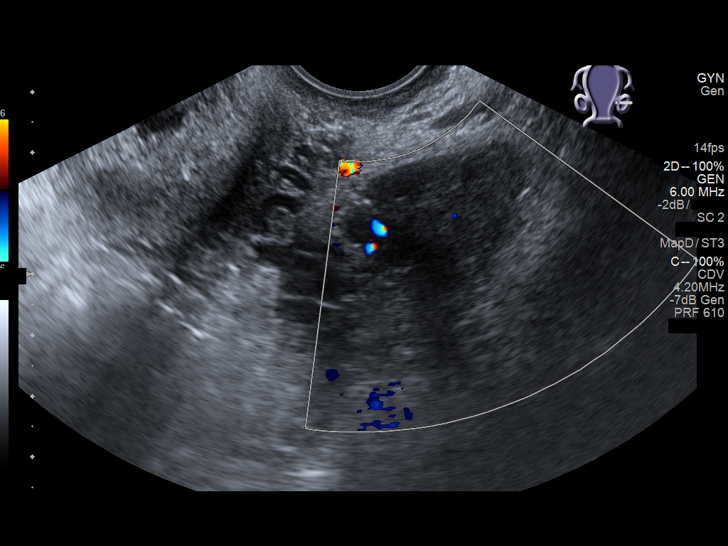

[13 of 25 positions shown; findings below may reference images not displayed]

FINDINGS: Uterus

Measurements: 9.7 x 5.1 x 5 cm. No fibroids or other mass
visualized.

Endometrium

Thickness: 12 mm.. Distended with echogenic avascular echogenic
debris measuring up to 4.4 x 2.8 x 3.7 cm in the lower uterine
segment. No discrete gestational sac or IUP.

Right ovary

Measurements: 4.7 x 3.1 x 2.7 cm. 2.6 x 2.4 x 2 cm intra ovarian
thick-walled corpus luteal cyst.

Left ovary

Measurements: 3.2 x 2.3 x 2.2 cm. Normal appearance/no adnexal mass.

Other findings

No abnormal free fluid.
IMPRESSION: Endometrial cavity distended with blood products, less likely
products of conception without discrete gestational sac or IUP.
Recommend serial HCG and, follow-up ultrasound as clinically
indicated.

## 2018-06-08 ENCOUNTER — Encounter: Payer: Self-pay | Admitting: Radiology

## 2018-08-12 ENCOUNTER — Encounter: Payer: Self-pay | Admitting: Physician Assistant

## 2018-08-12 ENCOUNTER — Other Ambulatory Visit: Payer: Self-pay

## 2018-08-12 ENCOUNTER — Ambulatory Visit (INDEPENDENT_AMBULATORY_CARE_PROVIDER_SITE_OTHER): Payer: 59 | Admitting: Physician Assistant

## 2018-08-12 VITALS — BP 115/78 | HR 80 | Temp 98.7°F | Resp 18 | Ht 63.39 in | Wt 177.4 lb

## 2018-08-12 DIAGNOSIS — R609 Edema, unspecified: Secondary | ICD-10-CM | POA: Diagnosis not present

## 2018-08-12 DIAGNOSIS — G43009 Migraine without aura, not intractable, without status migrainosus: Secondary | ICD-10-CM

## 2018-08-12 DIAGNOSIS — Z7689 Persons encountering health services in other specified circumstances: Secondary | ICD-10-CM

## 2018-08-12 MED ORDER — SUMATRIPTAN SUCCINATE 25 MG PO TABS: ORAL_TABLET | ORAL | 1 refills | Status: DC

## 2018-08-12 NOTE — Progress Notes (Signed)
Michelle Neal  MRN: [id number] DOB: 11/01/1991  Subjective:   Michelle Neal is a 27 y.o. female who presents for chief complaint of establishing care and headaches.  Has not had a PCP in quite some time. Has PMH of migraines. Smptoms began about 5 years ago. Generally, the headaches last about 3 hours and occur once per week. The headaches are usually worse in the morning. The headaches are usually throbbing and are located unilaterally.  The patient rates her most severe headaches a 8 on a scale from 1 to 10. Recently, the headaches have been increasing in frequency. Work attendance or other daily activities are not affected by the headaches. Precipitating factors include: none which have been determined. The headaches are usually not preceded by an aura. Associated neurologic symptoms: lightheadness. Will have associated photophobia and phonophobia. The patient denies depression, loss of balance, muscle weakness, numbness of extremities, speech difficulties, vision problems, vomiting in the early morning and worsening school/work performance. Home treatment has included BC goody powder and darkening the room with improvement.  If no relief with ibuprofen.  He is to have prescription for nasal Imitrex and this worked very well for migraines.  Other history includes: nothing pertinent. Family history includes migraine headaches in mother. Takes birth control daily. No recent surgery/travel/immobilization, hx of cancer, hemoptysis, prior DVT/PE. No PMH of HTN, heart disease, and seizures.   Review of Systems  Constitutional: Negative for chills, diaphoresis and fever.  Respiratory: Negative for cough, shortness of breath and wheezing.        Negative for orthopnea and DOE.   Cardiovascular: Positive for leg swelling (b/l lower leg swelling x years, worse after she sits all day at work, no overlying redness,warmth, or pain, eats high sodium diet, drinks some water, has never tried anything for relief).  Negative for chest pain and palpitations.  Gastrointestinal: Negative for abdominal pain, nausea and vomiting.       Patient Active Problem List   Diagnosis Date Noted  . PROM (premature rupture of membranes) 07/08/2017  . NSVD (normal spontaneous vaginal delivery) 07/08/2017  . GBS (group B Streptococcus carrier), +RV culture, currently pregnant 06/20/2017  . LGSIL on Pap smear of cervix 01/06/2017  . Supervision of normal pregnancy, antepartum 12/29/2016  . Anemia in pregnancy 12/29/2016  . Thrombocytopenia affecting pregnancy (HCC) 12/29/2016    Current Outpatient Medications on File Prior to Visit  Medication Sig Dispense Refill  . CRYSELLE-28 0.3-30 MG-MCG tablet      No current facility-administered medications on file prior to visit.     No Known Allergies    Social History   Socioeconomic History  . Marital status: Single    Spouse name: Not on file  . Number of children: Not on file  . Years of education: Not on file  . Highest education level: Not on file  Occupational History  . Not on file  Social Needs  . Financial resource strain: Not on file  . Food insecurity:    Worry: Not on file    Inability: Not on file  . Transportation needs:    Medical: Not on file    Non-medical: Not on file  Tobacco Use  . Smoking status: Never Smoker  . Smokeless tobacco: Never Used  Substance and Sexual Activity  . Alcohol use: Yes    Comment: Before pregnancy  . Drug use: No  . Sexual activity: Yes    Partners: Male    Birth control/protection: None  Lifestyle  . Physical  activity:    Days per week: Not on file    Minutes per session: Not on file  . Stress: Not on file  Relationships  . Social connections:    Talks on phone: Not on file    Gets together: Not on file    Attends religious service: Not on file    Active member of club or organization: Not on file    Attends meetings of clubs or organizations: Not on file    Relationship status: Not on file  .  Intimate partner violence:    Fear of current or ex partner: Not on file    Emotionally abused: Not on file    Physically abused: Not on file    Forced sexual activity: Not on file  Other Topics Concern  . Not on file  Social History Narrative  . Not on file    Objective:  BP 115/78   Pulse 80   Temp 98.7 F (37.1 C) (Oral)   Resp 18   Ht 5' 3.39" (1.61 m)   Wt 177 lb 6.4 oz (80.5 kg)   LMP 08/06/2018   SpO2 98%   BMI 31.04 kg/m   Physical Exam  Constitutional: She is oriented to person, place, and time. She appears well-developed and well-nourished. No distress.  HENT:  Head: Normocephalic and atraumatic.  Mouth/Throat: Uvula is midline, oropharynx is clear and moist and mucous membranes are normal. No tonsillar exudate.  No pain with palpation of bilateral temporal regions.   Eyes: Pupils are equal, round, and reactive to light. Conjunctivae and EOM are normal.  Fundoscopic exam:      The right eye shows no hemorrhage and no papilledema. The right eye shows red reflex.       The left eye shows no hemorrhage and no papilledema. The left eye shows red reflex.  Neck: Normal range of motion and full passive range of motion without pain. No Brudzinski's sign and no Kernig's sign noted.  Cardiovascular: Normal rate, regular rhythm, normal heart sounds and intact distal pulses.  Pulmonary/Chest: Effort normal and breath sounds normal. She has no wheezes. She has no rhonchi. She has no rales.  Musculoskeletal:       Right lower leg: She exhibits no tenderness and no swelling.       Left lower leg: She exhibits no tenderness and no swelling.  Neurological: She is alert and oriented to person, place, and time. She has normal strength and normal reflexes. No cranial nerve deficit or sensory deficit. She displays a negative Romberg sign. Gait normal.  Normal FNF and HTS test.  Normal Tandem walk.   Skin: Skin is warm and dry.  No overlying erythema, warmth, or tenderness on exam of  BLE.   Psychiatric: She has a normal mood and affect.  Vitals reviewed.   Visual Acuity Screening   Right eye Left eye Both eyes  Without correction: 20/15 20/15 20/15   With correction:        Assessment and Plan :  1. Migraine without aura and without status migrainosus, not intractable Asx today. Hx consistent with migraines without aura. Has had success with imitrex nasal spray in the past. Prefers oral medication. Episodic therapy: Lie in darkened room and apply cold packs as needed for pain.Rec trial of oral triptan therapy due to frequency of episodes and no improvement with ibuprofen.  Side effect profile discussed in detail. Asked to keep headache diary. Patient reassured that neurodiagnostic workup not indicated from benign H&P. Follow  up in 4 weeks. If no improvement at that time, consider referral to neurology. - SUMAtriptan (IMITREX) 25 MG tablet; Take 1 tablet at onset of symptoms. May repeat in 2 hours if headache persists or recurs. Max dose is 200mg  in 24 hours.  Dispense: 10 tablet; Refill: 1  2. Dependent edema Chronic issue.  History consistent with dependent edema.  Normal vital signs.  Normal physical exam today.  Recommend elevating legs at nighttime after sitting for long periods at work.  Also recommend wearing compression stockings at work.  Decrease salt intake and increase water consumption.  Patient is to return for complete physical exam in about 4 weeks.  Will obtain lab work at that time. Given strict return/ED precautions.  3. Encounter to establish care I am happy to take over patient care.   Side effects, risks, benefits, and alternatives of the medications and treatment plan prescribed today were discussed, and patient expressed understanding of the instructions given. No barriers to understanding were identified. Red flags discussed in detail. Pt expressed understanding regarding what to do in case of emergency/urgent symptoms.  Benjiman CoreBrittany Espn Zeman PA-C   [facility]  [facility] 08/12/2018 11:31 AM

## 2018-08-12 NOTE — Patient Instructions (Addendum)
For migraines, I have given you a prescription for sumatriptan.  Please take as prescribed.  Start keeping a headache diary so you can figure out if there are any precipitating factors.  Follow-up with me in 1 month for reevaluation.  We will do a complete physical exam obtain lab time.  Make sure you are fasting, no food for 8 hours prior to visit.  For lower leg swelling, make sure you drink at least 64 ounces of water per day.  Avoid salty foods.  Elevate your legs when you get home.  You may use compression stockings while at work sitting for long periods of time.  If you develop any redness, warmth, pain, or other concerning symptoms, please seek care immediately.  Thank you for letting me participate in your health and well being.  Migraine Headache A migraine headache is a very strong throbbing pain on one side or both sides of your head. Migraines can also cause other symptoms. Talk with your doctor about what things may bring on (trigger) your migraine headaches. Follow these instructions at home: Medicines  Take over-the-counter and prescription medicines only as told by your doctor.  Do not drive or use heavy machinery while taking prescription pain medicine.  To prevent or treat constipation while you are taking prescription pain medicine, your doctor may recommend that you: ? Drink enough fluid to keep your pee (urine) clear or pale yellow. ? Take over-the-counter or prescription medicines. ? Eat foods that are high in fiber. These include fresh fruits and vegetables, whole grains, and beans. ? Limit foods that are high in fat and processed sugars. These include fried and sweet foods. Lifestyle  Avoid alcohol.  Do not use any products that contain nicotine or tobacco, such as cigarettes and e-cigarettes. If you need help quitting, ask your doctor.  Get at least 8 hours of sleep every night.  Limit your stress. General instructions   Keep a journal to find out what may  bring on your migraines. For example, write down: ? What you eat and drink. ? How much sleep you get. ? Any change in what you eat or drink. ? Any change in your medicines.  If you have a migraine: ? Avoid things that make your symptoms worse, such as bright lights. ? It may help to lie down in a dark, quiet room. ? Do not drive or use heavy machinery. ? Ask your doctor what activities are safe for you.  Keep all follow-up visits as told by your doctor. This is important. Contact a doctor if:  You get a migraine that is different or worse than your usual migraines. Get help right away if:  Your migraine gets very bad.  You have a fever.  You have a stiff neck.  You have trouble seeing.  Your muscles feel weak or like you cannot control them.  You start to lose your balance a lot.  You start to have trouble walking.  You pass out (faint). This information is not intended to replace advice given to you by your health care provider. Make sure you discuss any questions you have with your health care provider. Document Released: 09/21/2008 Document Revised: 07/02/2016 Document Reviewed: 05/31/2016 Elsevier Interactive Patient Education  2018 [employer]  Sumatriptan tablets What is this medicine? SUMATRIPTAN (soo ma TRIP tan) is used to treat migraines with or without aura. An aura is a strange feeling or visual disturbance that warns you of an attack. It is not used to prevent  migraines. This medicine may be used for other purposes; ask your health care provider or pharmacist if you have questions. COMMON BRAND NAME(S): Imitrex, Migraine Pack What should I tell my health care provider before I take this medicine? They need to know if you have any of these conditions: -circulation problems in fingers and toes -diabetes -heart disease -high blood pressure -high cholesterol -history of irregular heartbeat -history of stroke -kidney disease -liver  disease -postmenopausal or surgical removal of uterus and ovaries -seizures -smoke tobacco -stomach or intestine problems -an unusual or allergic reaction to sumatriptan, other medicines, foods, dyes, or preservatives -pregnant or trying to get pregnant -breast-feeding How should I use this medicine? Take this medicine by mouth with a glass of water. Follow the directions on the prescription label. This medicine is taken at the first symptoms of a migraine. It is not for everyday use. If your migraine headache returns after one dose, you can take another dose as directed. You must leave at least 2 hours between doses, and do not take more than 100 mg as a single dose. Do not take more than 200 mg total in any 24 hour period. If there is no improvement at all after the first dose, do not take a second dose without talking to your doctor or health care professional. Do not take your medicine more often than directed. Talk to your pediatrician regarding the use of this medicine in children. Special care may be needed. Overdosage: If you think you have taken too much of this medicine contact a poison control center or emergency room at once. NOTE: This medicine is only for you. Do not share this medicine with others. What if I miss a dose? This does not apply; this medicine is not for regular use. What may interact with this medicine? Do not take this medicine with any of the following medicines: -cocaine -ergot alkaloids like dihydroergotamine, ergonovine, ergotamine, methylergonovine -feverfew -MAOIs like Carbex, Eldepryl, Marplan, Nardil, and Parnate -other medicines for migraine headache like almotriptan, eletriptan, frovatriptan, naratriptan, rizatriptan, zolmitriptan -tryptophan This medicine may also interact with the following medications: -certain medicines for depression, anxiety, or psychotic disturbances This list may not describe all possible interactions. Give your health care  provider a list of all the medicines, herbs, non-prescription drugs, or dietary supplements you use. Also tell them if you smoke, drink alcohol, or use illegal drugs. Some items may interact with your medicine. What should I watch for while using this medicine? Only take this medicine for a migraine headache. Take it if you get warning symptoms or at the start of a migraine attack. It is not for regular use to prevent migraine attacks. You may get drowsy or dizzy. Do not drive, use machinery, or do anything that needs mental alertness until you know how this medicine affects you. To reduce dizzy or fainting spells, do not sit or stand up quickly, especially if you are an older patient. Alcohol can increase drowsiness, dizziness and flushing. Avoid alcoholic drinks. Smoking cigarettes may increase the risk of heart-related side effects from using this medicine. If you take migraine medicines for 10 or more days a month, your migraines may get worse. Keep a diary of headache days and medicine use. Contact your healthcare professional if your migraine attacks occur more frequently. What side effects may I notice from receiving this medicine? Side effects that you should report to your doctor or health care professional as soon as possible: -allergic reactions like skin rash, itching or hives,  swelling of the face, lips, or tongue -bloody or watery diarrhea -hallucination, loss of contact with reality -pain, tingling, numbness in the face, hands, or feet -seizures -signs and symptoms of a blood clot such as breathing problems; changes in vision; chest pain; severe, sudden headache; pain, swelling, warmth in the leg; trouble speaking; sudden numbness or weakness of the face, arm, or leg -signs and symptoms of a dangerous change in heartbeat or heart rhythm like chest pain; dizziness; fast or irregular heartbeat; palpitations, feeling faint or lightheaded; falls; breathing problems -signs and symptoms of a  stroke like changes in vision; confusion; trouble speaking or understanding; severe headaches; sudden numbness or weakness of the face, arm, or leg; trouble walking; dizziness; loss of balance or coordination -stomach pain Side effects that usually do not require medical attention (report to your doctor or health care professional if they continue or are bothersome): -changes in taste -facial flushing -headache -muscle cramps -muscle pain -nausea, vomiting -weak or tired This list may not describe all possible side effects. Call your doctor for medical advice about side effects. You may report side effects to FDA at 1-800-FDA-1088. Where should I keep my medicine? Keep out of the reach of children. Store at room temperature between 2 and 30 degrees C (36 and 86 degrees F). Throw away any unused medicine after the expiration date. NOTE: This sheet is a summary. It may not cover all possible information. If you have questions about this medicine, talk to your doctor, pharmacist, or health care provider.  2018 Elsevier/Gold Standard (2016-01-15 12:38:23)   IF you received an x-ray today, you will receive an invoice from [facility]. Please contact [facility] at [phone] with questions or concerns regarding your invoice.   IF you received labwork today, you will receive an invoice from [place]. Please contact [facility] at [phone] with questions or concerns regarding your invoice.   Our billing staff will not be able to assist you with questions regarding bills from these companies.  You will be contacted with the lab results as soon as they are available. The fastest way to get your results is to activate your My Chart account. Instructions are located on the last page of this paperwork. If you have not heard from [place] regarding the results in 2 weeks, please contact this office.

## 2018-08-14 ENCOUNTER — Encounter: Payer: Self-pay | Admitting: Physician Assistant

## 2018-09-12 ENCOUNTER — Telehealth: Payer: Self-pay | Admitting: Physician Assistant

## 2018-10-21 ENCOUNTER — Ambulatory Visit: Payer: Self-pay | Admitting: Physician Assistant

## 2018-10-28 ENCOUNTER — Ambulatory Visit: Payer: 59 | Admitting: Physician Assistant

## 2018-12-27 DIAGNOSIS — U071 COVID-19: Secondary | ICD-10-CM

## 2018-12-27 HISTORY — DX: COVID-19: U07.1

## 2019-08-20 ENCOUNTER — Ambulatory Visit (INDEPENDENT_AMBULATORY_CARE_PROVIDER_SITE_OTHER): Payer: No Typology Code available for payment source | Admitting: Family Medicine

## 2019-08-20 ENCOUNTER — Encounter: Payer: Self-pay | Admitting: Family Medicine

## 2019-08-20 ENCOUNTER — Other Ambulatory Visit: Payer: Self-pay

## 2019-08-20 VITALS — BP 118/76 | HR 89 | Temp 97.9°F | Resp 16 | Ht 64.0 in | Wt 185.0 lb

## 2019-08-20 DIAGNOSIS — L83 Acanthosis nigricans: Secondary | ICD-10-CM

## 2019-08-20 DIAGNOSIS — E6609 Other obesity due to excess calories: Secondary | ICD-10-CM

## 2019-08-20 DIAGNOSIS — Z6831 Body mass index (BMI) 31.0-31.9, adult: Secondary | ICD-10-CM | POA: Diagnosis not present

## 2019-08-20 DIAGNOSIS — G43009 Migraine without aura, not intractable, without status migrainosus: Secondary | ICD-10-CM | POA: Diagnosis not present

## 2019-08-20 MED ORDER — PREDNISONE 20 MG PO TABS: ORAL_TABLET | ORAL | 0 refills | Status: DC

## 2019-08-20 MED ORDER — SUMATRIPTAN SUCCINATE 25 MG PO TABS
ORAL_TABLET | ORAL | 1 refills | Status: DC
Start: 2019-08-20 — End: 2019-12-04

## 2019-08-20 NOTE — Progress Notes (Signed)
Name: Michelle Neal   MRN: [id number]    DOB: 1991/07/12   Date:08/20/2019       Progress Note  Subjective  Chief Complaint  Chief Complaint  Patient presents with  . Establish Care  . Migraine  . Obesity    HPI  PT presents to establish care and for the following:  Headaches:  She has been having headaches every other or daily for years now.  She notes the pain is usually unilateral (changes sides) and is thumping/throbbing in nature. Typically has to lay down to get rid of the headache; will sometimes get blurred vision, photosensitivity.  She denies N/V, sound sensitivity, or aura.  Stopped OCP for a period of time and this did not help, so she went back on - advised to stop for right now - will use condoms for contraception.  She takes BC powders 2-3 times a week.  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 Mom with history of migraines.  Discussed several causes of migraines - no caffeine intake; suspect rebound headaches in combination with migraine occurring due to NSAID overuse.  Lifestyle changes discussed in detail.  Obesity: She has gained about 55lbs since her daughter was born in 2018.  She has tried diets, she has tried exercise, none of which seemed to work. She is trying to eat mostly veggies and proteins right now; does well at home during the day, but after that she is really hungry and her diet becomes poor.  Discussed medications.  No family history of thyroid cancer; no personal history of pancreatitis or thyroid cancer; no history of seizures.   Patient Active Problem List   Diagnosis Date Noted  . PROM (premature rupture of membranes) 07/08/2017  . NSVD (normal spontaneous vaginal delivery) 07/08/2017  . GBS (group B Streptococcus carrier), +RV culture, currently pregnant 06/20/2017  . LGSIL on Pap smear of cervix 01/06/2017  . Anemia in pregnancy 12/29/2016  . Normocytic anemia 11/03/2016  . Positive ANA (antinuclear antibody)  11/03/2016    No past surgical history on file.  Family History  Problem Relation Age of Onset  . Hypertension Mother   . Breast cancer Maternal Aunt        40-50'S AGE OF ONSET    Social History   Socioeconomic History  . Marital status: Single    Spouse name: Not on file  . Number of children: 2  . Years of education: Not on file  . Highest education level: Not on file  Occupational History  . Not on file  Social Needs  . Financial resource strain: Not hard at all  . Food insecurity    Worry: Never true    Inability: Never true  . Transportation needs    Medical: No    Non-medical: No  Tobacco Use  . Smoking status: Never Smoker  . Smokeless tobacco: Never Used  Substance and Sexual Activity  . Alcohol use: Yes    Comment: Before pregnancy  . Drug use: No  . Sexual activity: Yes    Partners: Male    Birth control/protection: None  Lifestyle  . Physical activity    Days per week: 2 days    Minutes per session: 30 min  . Stress: Not at all  Relationships  . Social connections    Talks on phone: More than three times a week    Gets together: Never    Attends religious service: More than 4 times per year  Active member of club or organization: Yes    Attends meetings of clubs or organizations: Never    Relationship status: Never married  . Intimate partner violence    Fear of current or ex partner: No    Emotionally abused: No    Physically abused: No    Forced sexual activity: No  Other Topics Concern  . Not on file  Social History Narrative  . Not on file     Current Outpatient Medications:  .  CRYSELLE-28 0.3-30 MG-MCG tablet, , Disp: , Rfl:  .  SUMAtriptan (IMITREX) 25 MG tablet, Take 1 tablet at onset of symptoms. May repeat in 2 hours if headache persists or recurs. Max dose is 200mg  in 24 hours., Disp: 10 tablet, Rfl: 1  No Known Allergies  I personally reviewed active problem list, medication list, allergies, notes from last encounter,  lab results with the patient/caregiver today.   ROS  Constitutional: Negative for fever or weight change.  Respiratory: Negative for cough and shortness of breath.   Cardiovascular: Negative for chest pain or palpitations.  Gastrointestinal: Negative for abdominal pain, no bowel changes.  Musculoskeletal: Negative for gait problem or joint swelling.  Skin: Negative for rash.  Neurological: Negative for dizziness or headache.  No other specific complaints in a complete review of systems (except as listed in HPI above).  Objective  Vitals:   08/20/19 1005  BP: 118/76  Pulse: 89  Resp: 16  Temp: 97.9 F (36.6 C)  TempSrc: Oral  SpO2: 93%  Weight: 185 lb (83.9 kg)  Height: 5\' 4"  (1.626 m)   Body mass index is 31.76 kg/m.  Physical Exam   Constitutional: Patient appears well-developed and well-nourished. No distress.  HENT: Head: Normocephalic and atraumatic.  Eyes: Conjunctivae and EOM are normal. No scleral icterus.  Pupils are equal, round, and reactive to light.  Neck: Normal range of motion. Neck supple. No JVD present. No thyromegaly present.  Cardiovascular: Normal rate, regular rhythm and normal heart sounds.  No murmur heard. No BLE edema. Pulmonary/Chest: Effort normal and breath sounds normal. No respiratory distress. Musculoskeletal: Normal range of motion, no joint effusions. No gross deformities Neurological: Pt is alert and oriented to person, place, and time. No cranial nerve deficit. Coordination, balance, strength, speech and gait are normal.  Skin: Skin is warm and dry. No rash noted. No erythema.  Psychiatric: Patient has a normal mood and affect. behavior is normal. Judgment and thought content normal.  No results found for this or any previous visit (from the past 72 hour(s)).   PHQ2/9: [facility] screen [facility] 08/20/2019 08/12/2018  Decreased Interest 0 0  Down, Depressed, Hopeless 0 0  PHQ - 2 Score 0 0  Altered sleeping 0 -  Tired, decreased  energy 0 -  Change in appetite 0 -  Feeling bad or failure about yourself  0 -  Trouble concentrating 0 -  Moving slowly or fidgety/restless 0 -  Suicidal thoughts 0 -  PHQ-9 Score 0 -  Difficult doing work/chores Not difficult at all -   PHQ-2/9 Result is negative.    Fall Risk: Fall Risk  08/20/2019 08/12/2018  Falls in the past year? 0 No  Number falls in past yr: 0 -  Injury with Fall? 0 -  Follow up Falls evaluation completed -   Assessment & Plan  1. Migraine without aura and without status migrainosus, not intractable - SUMAtriptan (IMITREX) 25 MG tablet; Take 1 tablet at onset of symptoms. May repeat  in 2 hours if headache persists or recurs. Max dose is 2 tablets in 24 hours.  Dispense: 10 tablet; Refill: 1 - predniSONE (DELTASONE) 20 MG tablet; Take 1 tablet in the AM, take 1 tablet 4 hours later. Take with food. Use if Imitrex does not work.  Dispense: 10 tablet; Refill: 0 - STOP BC powders; migraine diet discussed in detail.  2. Class 1 obesity due to excess calories without serious comorbidity with body mass index (BMI) of 31.0 to 31.9 in adult - Discussed importance of 150 minutes of physical activity weekly, eat two servings of fish weekly, eat one serving of tree nuts ( cashews, pistachios, pecans, almonds.Marland Kitchen.) every other day, eat 6 servings of fruit/vegetables daily and drink plenty of water and avoid sweet beverages.  - COMPLETE METABOLIC PANEL WITH GFR - Lipid panel - TSH - Hemoglobin A1c - Insulin, random  3. Acanthosis nigricans - COMPLETE METABOLIC PANEL WITH GFR - Hemoglobin A1c - Insulin, random

## 2019-08-20 NOTE — Patient Instructions (Addendum)
Metformin; Saxenda/Victoza, Ozempic, Qysemia, Contrave    Diabetes Mellitus and Nutrition, Adult When you have diabetes (diabetes mellitus), it is very important to have healthy eating habits because your blood sugar (glucose) levels are greatly affected by what you eat and drink. Eating healthy foods in the appropriate amounts, at about the same times every day, can help you:  Control your blood glucose.  Lower your risk of heart disease.  Improve your blood pressure.  Reach or maintain a healthy weight. Every person with diabetes is different, and each person has different needs for a meal plan. Your health care provider may recommend that you work with a diet and nutrition specialist (dietitian) to make a meal plan that is best for you. Your meal plan may vary depending on factors such as:  The calories you need.  The medicines you take.  Your weight.  Your blood glucose, blood pressure, and cholesterol levels.  Your activity level.  Other health conditions you have, such as heart or kidney disease. How do carbohydrates affect me? Carbohydrates, also called carbs, affect your blood glucose level more than any other type of food. Eating carbs naturally raises the amount of glucose in your blood. Carb counting is a method for keeping track of how many carbs you eat. Counting carbs is important to keep your blood glucose at a healthy level, especially if you use insulin or take certain oral diabetes medicines. It is important to know how many carbs you can safely have in each meal. This is different for every person. Your dietitian can help you calculate how many carbs you should have at each meal and for each snack. Foods that contain carbs include:  Bread, cereal, rice, pasta, and crackers.  Potatoes and corn.  Peas, beans, and lentils.  Milk and yogurt.  Fruit and juice.  Desserts, such as cakes, cookies, ice cream, and candy. How does alcohol affect me? Alcohol can  cause a sudden decrease in blood glucose (hypoglycemia), especially if you use insulin or take certain oral diabetes medicines. Hypoglycemia can be a life-threatening condition. Symptoms of hypoglycemia (sleepiness, dizziness, and confusion) are similar to symptoms of having too much alcohol. If your health care provider says that alcohol is safe for you, follow these guidelines:  Limit alcohol intake to no more than 1 drink per day for nonpregnant women and 2 drinks per day for men. One drink equals 12 oz of beer, 5 oz of wine, or 1 oz of hard liquor.  Do not drink on an empty stomach.  Keep yourself hydrated with water, diet soda, or unsweetened iced tea.  Keep in mind that regular soda, juice, and other mixers may contain a lot of sugar and must be counted as carbs. What are tips for following this plan?  Reading food labels  Start by checking the serving size on the "Nutrition Facts" label of packaged foods and drinks. The amount of calories, carbs, fats, and other nutrients listed on the label is based on one serving of the item. Many items contain more than one serving per package.  Check the total grams (g) of carbs in one serving. You can calculate the number of servings of carbs in one serving by dividing the total carbs by 15. For example, if a food has 30 g of total carbs, it would be equal to 2 servings of carbs.  Check the number of grams (g) of saturated and trans fats in one serving. Choose foods that have low or no amount  of these fats.  Check the number of milligrams (mg) of salt (sodium) in one serving. Most people should limit total sodium intake to less than 2,300 mg per day.  Always check the nutrition information of foods labeled as "low-fat" or "nonfat". These foods may be higher in added sugar or refined carbs and should be avoided.  Talk to your dietitian to identify your daily goals for nutrients listed on the label. Shopping  Avoid buying canned, premade, or  processed foods. These foods tend to be high in fat, sodium, and added sugar.  Shop around the outside edge of the grocery store. This includes fresh fruits and vegetables, bulk grains, fresh meats, and fresh dairy. Cooking  Use low-heat cooking methods, such as baking, instead of high-heat cooking methods like deep frying.  Cook using healthy oils, such as olive, canola, or sunflower oil.  Avoid cooking with butter, cream, or high-fat meats. Meal planning  Eat meals and snacks regularly, preferably at the same times every day. Avoid going long periods of time without eating.  Eat foods high in fiber, such as fresh fruits, vegetables, beans, and whole grains. Talk to your dietitian about how many servings of carbs you can eat at each meal.  Eat 4-6 ounces (oz) of lean protein each day, such as lean meat, chicken, fish, eggs, or tofu. One oz of lean protein is equal to: ? 1 oz of meat, chicken, or fish. ? 1 egg. ?  cup of tofu.  Eat some foods each day that contain healthy fats, such as avocado, nuts, seeds, and fish. Lifestyle  Check your blood glucose regularly.  Exercise regularly as told by your health care provider. This may include: ? 150 minutes of moderate-intensity or vigorous-intensity exercise each week. This could be brisk walking, biking, or water aerobics. ? Stretching and doing strength exercises, such as yoga or weightlifting, at least 2 times a week.  Take medicines as told by your health care provider.  Do not use any products that contain nicotine or tobacco, such as cigarettes and e-cigarettes. If you need help quitting, ask your health care provider.  Work with a Veterinary surgeoncounselor or diabetes educator to identify strategies to manage stress and any emotional and social challenges. Questions to ask a health care provider  Do I need to meet with a diabetes educator?  Do I need to meet with a dietitian?  What number can I call if I have questions?  When are the  best times to check my blood glucose? Where to find more information:  American Diabetes Association: diabetes.org  Academy of Nutrition and Dietetics: www.eatright.[employer]  National Institute of Diabetes and Digestive and Kidney Diseases (NIH): CarFlippers.tnwww.niddk.nih.gov Summary  A healthy meal plan will help you control your blood glucose and maintain a healthy lifestyle.  Working with a diet and nutrition specialist (dietitian) can help you make a meal plan that is best for you.  Keep in mind that carbohydrates (carbs) and alcohol have immediate effects on your blood glucose levels. It is important to count carbs and to use alcohol carefully. This information is not intended to replace advice given to you by your health care provider. Make sure you discuss any questions you have with your health care provider. Document Released: 09/09/2005 Document Revised: 11/25/2017 Document Reviewed: 01/17/2017 Elsevier Patient Education  2020 [employer]

## 2019-08-21 ENCOUNTER — Ambulatory Visit: Payer: Self-pay | Admitting: Family Medicine

## 2019-08-21 LAB — LIPID PANEL
Cholesterol: 158 mg/dL (ref <200)
HDL: 49 mg/dL — ABNORMAL LOW (ref >=50)
LDL Cholesterol (Calc): 95 mg/dL (calc)
Non-HDL Cholesterol (Calc): 109 mg/dL (calc) (ref <130)
Total CHOL/HDL Ratio: 3.2 (calc) (ref <5.0)
Triglycerides: 54 mg/dL (ref <150)

## 2019-08-21 LAB — INSULIN, RANDOM: Insulin: 8.2 u[IU]/mL

## 2019-08-21 LAB — HEMOGLOBIN A1C
Hgb A1c MFr Bld: 4.9 % of total Hgb (ref <5.7)
Mean Plasma Glucose: 94 (calc)
eAG (mmol/L): 5.2 (calc)

## 2019-08-21 LAB — COMPLETE METABOLIC PANEL WITH GFR
AG Ratio: 1.4 (calc) (ref 1.0–2.5)
ALT: 56 U/L — ABNORMAL HIGH (ref 6–29)
AST: 29 U/L (ref 10–30)
Albumin: 4.6 g/dL (ref 3.6–5.1)
Alkaline phosphatase (APISO): 48 U/L (ref 31–125)
BUN: 14 mg/dL (ref 7–25)
CO2: 23 mmol/L (ref 20–32)
Calcium: 9.3 mg/dL (ref 8.6–10.2)
Chloride: 106 mmol/L (ref 98–110)
Creat: 0.83 mg/dL (ref 0.50–1.10)
GFR, Est African American: 112 mL/min/{1.73_m2} (ref >=60)
GFR, Est Non African American: 97 mL/min/{1.73_m2} (ref >=60)
Globulin: 3.2 g/dL (calc) (ref 1.9–3.7)
Glucose, Bld: 81 mg/dL (ref 65–99)
Potassium: 4.4 mmol/L (ref 3.5–5.3)
Sodium: 137 mmol/L (ref 135–146)
Total Bilirubin: 0.4 mg/dL (ref 0.2–1.2)
Total Protein: 7.8 g/dL (ref 6.1–8.1)

## 2019-08-21 LAB — TSH: TSH: 1.09 mIU/L

## 2019-08-22 ENCOUNTER — Encounter: Payer: Self-pay | Admitting: Family Medicine

## 2019-08-22 DIAGNOSIS — Z6831 Body mass index (BMI) 31.0-31.9, adult: Secondary | ICD-10-CM

## 2019-08-22 DIAGNOSIS — E6609 Other obesity due to excess calories: Secondary | ICD-10-CM

## 2019-08-22 MED ORDER — CONTRAVE 8-90 MG PO TB12: ORAL_TABLET | ORAL | 0 refills | Status: DC

## 2019-09-17 ENCOUNTER — Encounter: Payer: Self-pay | Admitting: Family Medicine

## 2019-09-26 ENCOUNTER — Encounter: Payer: Self-pay | Admitting: Family Medicine

## 2019-10-01 ENCOUNTER — Ambulatory Visit (INDEPENDENT_AMBULATORY_CARE_PROVIDER_SITE_OTHER): Payer: No Typology Code available for payment source | Admitting: Family Medicine

## 2019-10-01 ENCOUNTER — Encounter: Payer: Self-pay | Admitting: Family Medicine

## 2019-10-01 ENCOUNTER — Other Ambulatory Visit: Payer: Self-pay

## 2019-10-01 VITALS — BP 119/72 | HR 84 | Temp 97.5°F | Resp 16 | Ht 64.0 in | Wt 183.3 lb

## 2019-10-01 DIAGNOSIS — E66811 Obesity, class 1: Secondary | ICD-10-CM

## 2019-10-01 DIAGNOSIS — E6609 Other obesity due to excess calories: Secondary | ICD-10-CM | POA: Diagnosis not present

## 2019-10-01 DIAGNOSIS — Z6831 Body mass index (BMI) 31.0-31.9, adult: Secondary | ICD-10-CM | POA: Diagnosis not present

## 2019-10-01 DIAGNOSIS — G43009 Migraine without aura, not intractable, without status migrainosus: Secondary | ICD-10-CM

## 2019-10-01 MED ORDER — AIMOVIG 70 MG/ML ~~LOC~~ SOAJ: 70.0000 mg | SUBCUTANEOUS | 3 refills | Status: DC

## 2019-10-01 NOTE — Patient Instructions (Addendum)
Decrease your Contrave to 1 tablet once daily for 5 days, then stop. Aimovig - once monthly injection for migraines. Take coupon to the pharmacy.  Call to schedule a nurse visit if you would like for your first injection.

## 2019-10-01 NOTE — Progress Notes (Signed)
Name: Michelle Neal   MRN: [id number]    DOB: 1991/02/11   Date:10/01/2019       Progress Note  Subjective  Chief Complaint  Chief Complaint  Patient presents with  . Follow-up    headaches, FMLA paperwork    HPI  Headaches:  She has been having headaches every other or daily for years now.  She notes the pain is usually unilateral (changes sides) and is thumping/throbbing in nature. Typically has to lay down to get rid of the headache; will sometimes get blurred vision, photosensitivity.  She denies N/V, sound sensitivity, or aura.  Stopped OCP for a period of time and this did not help - using condoms for contraception.  She stopped taking BV powders/NSAID use. 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 Mom with history of migraines.  She tried Imitrex and prednisone after our last visit, but neither seemed to help with her migraine symptoms.  We discussed topamax vs Aimovig - we will trial Aimovig today if insurance will cover.   Obesity: She has gained about 55lbs since her daughter was born in 2018.  She has tried diets, she has tried exercise, none of which seemed to work. She is trying to eat mostly veggies and proteins right now; does well at home during the day, but after that she is really hungry and her diet becomes poor.  Discussed medications.  No family history of thyroid cancer; no personal history of pancreatitis or thyroid cancer; no history of seizures.  She has been taking contrave, but feels like it has been making her very irritable. Will stop medication for now and work on migraine control prior to starting any additional medications.  Patient Active Problem List   Diagnosis Date Noted  . Migraine without aura and without status migrainosus, not intractable 08/20/2019  . PROM (premature rupture of membranes) 07/08/2017  . NSVD (normal spontaneous vaginal delivery) 07/08/2017  . GBS (group B Streptococcus carrier), +RV culture, currently  pregnant 06/20/2017  . LGSIL on Pap smear of cervix 01/06/2017  . Anemia in pregnancy 12/29/2016  . Normocytic anemia 11/03/2016  . Positive ANA (antinuclear antibody) 11/03/2016    No past surgical history on file.  Family History  Problem Relation Age of Onset  . Hypertension Mother   . Breast cancer Maternal Aunt        40-50'S AGE OF ONSET    Social History   Socioeconomic History  . Marital status: Single    Spouse name: Not on file  . Number of children: 2  . Years of education: Not on file  . Highest education level: Not on file  Occupational History  . Not on file  Social Needs  . Financial resource strain: Not hard at all  . Food insecurity    Worry: Never true    Inability: Never true  . Transportation needs    Medical: No    Non-medical: No  Tobacco Use  . Smoking status: Never Smoker  . Smokeless tobacco: Never Used  Substance and Sexual Activity  . Alcohol use: Yes    Comment: Before pregnancy  . Drug use: No  . Sexual activity: Yes    Partners: Male    Birth control/protection: None  Lifestyle  . Physical activity    Days per week: 2 days    Minutes per session: 30 min  . Stress: Not at all  Relationships  . Social Herbalist on phone:  More than three times a week    Gets together: Never    Attends religious service: More than 4 times per year    Active member of club or organization: Yes    Attends meetings of clubs or organizations: Never    Relationship status: Never married  . Intimate partner violence    Fear of current or ex partner: No    Emotionally abused: No    Physically abused: No    Forced sexual activity: No  Other Topics Concern  . Not on file  Social History Narrative  . Not on file     Current Outpatient Medications:  .  Naltrexone-buPROPion HCl ER (CONTRAVE) 8-90 MG TB12, Start 1 tablet every morning for 7 days, then 1 tablet twice daily for 7 days, then 2 tablets every morning and one every evening, then  2 tablets twice daily, Disp: 360 tablet, Rfl: 0 .  SUMAtriptan (IMITREX) 25 MG tablet, Take 1 tablet at onset of symptoms. May repeat in 2 hours if headache persists or recurs. Max dose is 2 tablets in 24 hours., Disp: 10 tablet, Rfl: 1 .  predniSONE (DELTASONE) 20 MG tablet, Take 1 tablet in the AM, take 1 tablet 4 hours later. Take with food. Use if Imitrex does not work. (Patient not taking: Reported on 10/01/2019), Disp: 10 tablet, Rfl: 0  No Known Allergies  I personally reviewed active problem list, medication list, allergies, notes from last encounter, lab results with the patient/caregiver today.   ROS  Constitutional: Negative for fever or weight change.  Respiratory: Negative for cough and shortness of breath.   Cardiovascular: Negative for chest pain or palpitations.  Gastrointestinal: Negative for abdominal pain, no bowel changes.  Musculoskeletal: Negative for gait problem or joint swelling.  Skin: Negative for rash.  Neurological: Negative for dizziness; + headaches.  No other specific complaints in a complete review of systems (except as listed in HPI above).  Objective  Vitals:   10/01/19 0945  BP: 119/72  Pulse: 84  Resp: 16  Temp: (!) 97.5 F (36.4 C)  TempSrc: Temporal  SpO2: 92%  Weight: 183 lb 4.8 oz (83.1 kg)  Height: 5\' 4"  (1.626 m)   Body mass index is 31.46 kg/m.  Physical Exam  Constitutional: Patient appears well-developed and well-nourished. No distress.  HENT: Head: Normocephalic and atraumatic.  Eyes: Conjunctivae and EOM are normal. No scleral icterus.  Neck: Normal range of motion. Neck supple. No JVD present.  Cardiovascular: Normal rate, regular rhythm and normal heart sounds.  No murmur heard. No BLE edema. Pulmonary/Chest: Effort normal and breath sounds normal. No respiratory distress. Musculoskeletal: Normal range of motion, no joint effusions. No gross deformities Neurological: Pt is alert and oriented to person, place, and time. No  cranial nerve deficit. Coordination, balance, strength, speech and gait are normal.  Skin: Skin is warm and dry. No rash noted. No erythema.  Psychiatric: Patient has a normal mood and affect. behavior is normal. Judgment and thought content normal.  No results found for this or any previous visit (from the past 72 hour(s)).   PHQ2/9: [facility] screen [facility] 2/[facility] 10/01/2019 08/20/2019 08/12/2018  Decreased Interest 0 0 0  Down, Depressed, Hopeless 0 0 0  PHQ - 2 Score 0 0 0  Altered sleeping 0 0 -  Tired, decreased energy 0 0 -  Change in appetite 0 0 -  Feeling bad or failure about yourself  0 0 -  Trouble concentrating 0 0 -  Moving slowly  or fidgety/restless 0 0 -  Suicidal thoughts 0 0 -  PHQ-9 Score 0 0 -  Difficult doing work/chores Not difficult at all Not difficult at all -   PHQ-2/9 Result is negative.    Fall Risk: Fall Risk  10/01/2019 08/20/2019 08/12/2018  Falls in the past year? 0 0 No  Number falls in past yr: 0 0 -  Injury with Fall? 0 0 -  Follow up Falls evaluation completed Falls evaluation completed -   Assessment & Plan  1. Migraine without aura and without status migrainosus, not intractable - If aimovig not effective, will consider referral to neurology at that time; if too expensive, we will try topamax. - Erenumab-aooe (AIMOVIG) 70 MG/ML SOAJ; Inject 70 mg into the skin every 30 (thirty) days.  Dispense: 3 pen; Refill: 3  2. Class 1 obesity due to excess calories without serious comorbidity with body mass index (BMI) of 31.0 to 31.9 in adult - Discussed importance of 150 minutes of physical activity weekly, eat two servings of fish weekly, eat one serving of tree nuts ( cashews, pistachios, pecans, almonds.Marland Kitchen) every other day, eat 6 servings of fruit/vegetables daily and drink plenty of water and avoid sweet beverages.  - No medications at this time; will monitor headache symptoms first and proceed from there.  FMLA for migraines: She is having several severe  migraines a month, we will provide 4 days a month of intermittent leave for migraines.  Paperwork is completed today for this.

## 2019-10-25 ENCOUNTER — Telehealth: Payer: Self-pay | Admitting: Family Medicine

## 2019-10-25 NOTE — Telephone Encounter (Signed)
Cover my meds calling to confirm you received the re submission for for the Erenumab-aooe (AIMOVIG) 70 MG/ML SOAJ  And will you pursue.  Ref key [place]  cb  [id number]

## 2019-10-26 NOTE — Telephone Encounter (Signed)
Kristeen Miss - I think this may be for you? Just let me know if you need anything from me.  If not covered by insurance, please run through [employer] assistance program. Thanks!

## 2019-10-26 NOTE — Telephone Encounter (Signed)
Please advise 

## 2019-10-29 NOTE — Telephone Encounter (Signed)
Message from Plan Request Reference Number: [id number]. AIMOVIG INJ 70MG /ML is denied for not meeting the prior authorization requirement(s). For further questions, call [phone]. Appeals are not supported through [place]. Please refer to the fax case notice for appeals information and instructions.

## 2019-11-30 ENCOUNTER — Ambulatory Visit: Payer: No Typology Code available for payment source | Admitting: Family Medicine

## 2019-12-04 ENCOUNTER — Other Ambulatory Visit: Payer: Self-pay

## 2019-12-04 ENCOUNTER — Encounter: Payer: Self-pay | Admitting: Family Medicine

## 2019-12-04 ENCOUNTER — Ambulatory Visit (INDEPENDENT_AMBULATORY_CARE_PROVIDER_SITE_OTHER): Payer: No Typology Code available for payment source | Admitting: Family Medicine

## 2019-12-04 VITALS — BP 122/80 | HR 94 | Temp 97.1°F | Resp 16 | Ht 64.0 in | Wt 190.7 lb

## 2019-12-04 DIAGNOSIS — G43009 Migraine without aura, not intractable, without status migrainosus: Secondary | ICD-10-CM | POA: Diagnosis not present

## 2019-12-04 MED ORDER — AMITRIPTYLINE HCL 10 MG PO TABS: 10.0000 mg | ORAL_TABLET | Freq: Every day | ORAL | 1 refills | Status: DC

## 2019-12-04 MED ORDER — SUMATRIPTAN SUCCINATE 25 MG PO TABS: ORAL_TABLET | ORAL | 1 refills | Status: DC

## 2019-12-04 NOTE — Progress Notes (Signed)
Name: Michelle Neal   MRN: [id number]    DOB: May 13, 1991   Date:12/04/2019       Progress Note  Subjective  Chief Complaint  Chief Complaint  Patient presents with  . Follow-up  . Migraine    HPI  Headaches: She is using Aimovig and is having a headache about every other to every 3 days now.  She is happy with this decrease as these were nearly daily prior to starting medication.  She has been very compliant with the Aimovig, no injection site reaction.  She has not tried the Imitrex yet - we will send in Rx again. If not working, will send in [place].  We will also add Elavil low dose today.  Typical Migraine:She notes the pain is usually unilateral (changes sides) and is thumping/throbbing in nature. Typically has to lay down to get rid of the headache; will sometimes get blurred vision, photosensitivity. She denies N/V, sound sensitivity, or aura.  - Stopped OCP for a period of time and this did not help - using condoms for contraception.  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 Mom with history of migraines.  Patient Active Problem List   Diagnosis Date Noted  . Migraine without aura and without status migrainosus, not intractable 08/20/2019  . PROM (premature rupture of membranes) 07/08/2017  . NSVD (normal spontaneous vaginal delivery) 07/08/2017  . GBS (group B Streptococcus carrier), +RV culture, currently pregnant 06/20/2017  . LGSIL on Pap smear of cervix 01/06/2017  . Anemia in pregnancy 12/29/2016  . Normocytic anemia 11/03/2016  . Positive ANA (antinuclear antibody) 11/03/2016    No past surgical history on file.  Family History  Problem Relation Age of Onset  . Hypertension Mother   . Breast cancer Maternal Aunt        40-50'S AGE OF ONSET    Social History   Socioeconomic History  . Marital status: Single    Spouse name: Not on file  . Number of children: 2  . Years of  education: Not on file  . Highest education level: Not on file  Occupational History  . Not on file  Social Needs  . Financial resource strain: Not hard at all  . Food insecurity    Worry: Never true    Inability: Never true  . Transportation needs    Medical: No    Non-medical: No  Tobacco Use  . Smoking status: Never Smoker  . Smokeless tobacco: Never Used  Substance and Sexual Activity  . Alcohol use: Yes    Comment: Before pregnancy  . Drug use: No  . Sexual activity: Yes    Partners: Male    Birth control/protection: None  Lifestyle  . Physical activity    Days per week: 2 days    Minutes per session: 30 min  . Stress: Not at all  Relationships  . Social connections    Talks on phone: More than three times a week    Gets together: Never    Attends religious service: More than 4 times per year    Active member of club or organization: Yes    Attends meetings of clubs or organizations: Never    Relationship status: Never married  . Intimate partner violence    Fear of current or ex partner: No    Emotionally abused: No    Physically abused: No    Forced sexual activity: No  Other  Topics Concern  . Not on file  Social History Narrative  . Not on file     Current Outpatient Medications:  .  Erenumab-aooe (AIMOVIG) 70 MG/ML SOAJ, Inject 70 mg into the skin every 30 (thirty) days., Disp: 3 pen, Rfl: 3 .  SUMAtriptan (IMITREX) 25 MG tablet, Take 1 tablet at onset of symptoms. May repeat in 2 hours if headache persists or recurs. Max dose is 2 tablets in 24 hours., Disp: 30 tablet, Rfl: 1  No Known Allergies  I personally reviewed active problem list, medication list, allergies, notes from last encounter, lab results with the patient/caregiver today.   ROS  Constitutional: Negative for fever or weight change.  Respiratory: Negative for cough and shortness of breath.   Cardiovascular: Negative for chest pain or palpitations.  Gastrointestinal: Negative for  abdominal pain, no bowel changes.  Musculoskeletal: Negative for gait problem or joint swelling.  Skin: Negative for rash.  Neurological: Negative for dizziness; + headache.  No other specific complaints in a complete review of systems (except as listed in HPI above).   Objective  Vitals:   12/04/19 0747  BP: 122/80  Pulse: 94  Resp: 16  Temp: (!) 97.1 F (36.2 C)  TempSrc: Temporal  SpO2: 96%  Weight: 190 lb 11.2 oz (86.5 kg)  Height: 5\' 4"  (1.626 m)    Body mass index is 32.73 kg/m.  Physical Exam  Constitutional: Patient appears well-developed and well-nourished. No distress.  HENT: Head: Normocephalic and atraumatic. Eyes: Conjunctivae and EOM are normal. No scleral icterus.   Neck: Normal range of motion. Neck supple. No JVD present.  Cardiovascular: Normal rate, regular rhythm and normal heart sounds.  No murmur heard. No BLE edema. Pulmonary/Chest: Effort normal and breath sounds normal. No respiratory distress. Musculoskeletal: Normal range of motion, no joint effusions. No gross deformities Neurological: Pt is alert and oriented to person, place, and time. No cranial nerve deficit. Coordination, balance, strength, speech and gait are normal.  Skin: Skin is warm and dry. No rash noted. No erythema.  Psychiatric: Patient has a normal mood and affect. behavior is normal. Judgment and thought content normal.  No results found for this or any previous visit (from the past 72 hour(s)).   PHQ2/9: Depression screen [facility] 12/04/2019 10/01/2019 08/20/2019 08/12/2018  Decreased Interest 0 0 0 0  Down, Depressed, Hopeless 0 0 0 0  PHQ - 2 Score 0 0 0 0  Altered sleeping 0 0 0 -  Tired, decreased energy 0 0 0 -  Change in appetite 0 0 0 -  Feeling bad or failure about yourself  0 0 0 -  Trouble concentrating 0 0 0 -  Moving slowly or fidgety/restless 0 0 0 -  Suicidal thoughts 0 0 0 -  PHQ-9 Score 0 0 0 -  Difficult doing work/chores Not difficult at all Not difficult  at all Not difficult at all -   PHQ-2/9 Result is negative.    Fall Risk: Fall Risk  12/04/2019 10/01/2019 08/20/2019 08/12/2018  Falls in the past year? 0 0 0 No  Number falls in past yr: 0 0 0 -  Injury with Fall? 0 0 0 -  Follow up Falls evaluation completed Falls evaluation completed Falls evaluation completed -     Assessment & Plan  1. Migraine without aura and without status migrainosus, not intractable - Will add elavil today at low dose, will plan to taper slowly if needed.  Continue Aimovig. If not improving after next  visit, we will refer to neurology. - SUMAtriptan (IMITREX) 25 MG tablet; Take 1 tablet at onset of symptoms. May repeat in 2 hours if headache persists or recurs. Max dose is 2 tablets in 24 hours.  Dispense: 30 tablet; Refill: 1 - amitriptyline (ELAVIL) 10 MG tablet; Take 1 tablet (10 mg total) by mouth at bedtime.  Dispense: 90 tablet; Refill: 1

## 2020-01-14 ENCOUNTER — Ambulatory Visit: Payer: No Typology Code available for payment source | Admitting: Family Medicine

## 2020-04-11 ENCOUNTER — Ambulatory Visit: Payer: Self-pay

## 2020-08-11 ENCOUNTER — Other Ambulatory Visit: Payer: Self-pay

## 2020-11-24 ENCOUNTER — Other Ambulatory Visit: Payer: Self-pay

## 2020-11-24 ENCOUNTER — Encounter: Payer: Self-pay | Admitting: Family Medicine

## 2020-11-24 ENCOUNTER — Ambulatory Visit (INDEPENDENT_AMBULATORY_CARE_PROVIDER_SITE_OTHER): Payer: No Typology Code available for payment source | Admitting: Family Medicine

## 2020-11-24 VITALS — BP 120/72 | HR 96 | Temp 98.1°F | Resp 16 | Ht 64.0 in | Wt 205.4 lb

## 2020-11-24 DIAGNOSIS — G43009 Migraine without aura, not intractable, without status migrainosus: Secondary | ICD-10-CM

## 2020-11-24 DIAGNOSIS — Z0289 Encounter for other administrative examinations: Secondary | ICD-10-CM | POA: Diagnosis not present

## 2020-11-24 NOTE — Patient Instructions (Signed)
Dr. Sherryll Burger [facility] Neurology - let me know if you want a referral    Migraine Headache A migraine headache is a very strong throbbing pain on one side or both sides of your head. This type of headache can also cause other symptoms. It can last from 4 hours to 3 days. Talk with your doctor about what things may bring on (trigger) this condition. What are the causes? The exact cause of this condition is not known. This condition may be triggered or caused by:  Drinking alcohol.  Smoking.  Taking medicines, such as: ? Medicine used to treat chest pain (nitroglycerin). ? Birth control pills. ? Estrogen. ? Some blood pressure medicines.  Eating or drinking certain products.  Doing physical activity. Other things that may trigger a migraine headache include:  Having a menstrual period.  Pregnancy.  Hunger.  Stress.  Not getting enough sleep or getting too much sleep.  Weather changes.  Tiredness (fatigue). What increases the risk?  Being 43-46 years old.  Being female.  Having a family history of migraine headaches.  Being Caucasian.  Having depression or anxiety.  Being very overweight. What are the signs or symptoms?  A throbbing pain. This pain may: ? Happen in any area of the head, such as on one side or both sides. ? Make it hard to do daily activities. ? Get worse with physical activity. ? Get worse around bright lights or loud noises.  Other symptoms may include: ? Feeling sick to your stomach (nauseous). ? Vomiting. ? Dizziness. ? Being sensitive to bright lights, loud noises, or smells.  Before you get a migraine headache, you may get warning signs (an aura). An aura may include: ? Seeing flashing lights or having blind spots. ? Seeing bright spots, halos, or zigzag lines. ? Having tunnel vision or blurred vision. ? Having numbness or a tingling feeling. ? Having trouble talking. ? Having weak muscles.  Some people have symptoms  after a migraine headache (postdromal phase), such as: ? Tiredness. ? Trouble thinking (concentrating). How is this treated?  Taking medicines that: ? Relieve pain. ? Relieve the feeling of being sick to your stomach. ? Prevent migraine headaches.  Treatment may also include: ? Having acupuncture. ? Avoiding foods that bring on migraine headaches. ? Learning ways to control your body functions (biofeedback). ? Therapy to help you know and deal with negative thoughts (cognitive behavioral therapy). Follow these instructions at home: Medicines  Take over-the-counter and prescription medicines only as told by your doctor.  Ask your doctor if the medicine prescribed to you: ? Requires you to avoid driving or using heavy machinery. ? Can cause trouble pooping (constipation). You may need to take these steps to prevent or treat trouble pooping:  Drink enough fluid to keep your pee (urine) pale yellow.  Take over-the-counter or prescription medicines.  Eat foods that are high in fiber. These include beans, whole grains, and fresh fruits and vegetables.  Limit foods that are high in fat and sugar. These include fried or sweet foods. Lifestyle  Do not drink alcohol.  Do not use any products that contain nicotine or tobacco, such as cigarettes, e-cigarettes, and chewing tobacco. If you need help quitting, ask your doctor.  Get at least 8 hours of sleep every night.  Limit and deal with stress. General instructions      Keep a journal to find out what may bring on your migraine headaches. For example, write down: ? What you eat and  drink. ? How much sleep you get. ? Any change in what you eat or drink. ? Any change in your medicines.  If you have a migraine headache: ? Avoid things that make your symptoms worse, such as bright lights. ? It may help to lie down in a dark, quiet room. ? Do not drive or use heavy machinery. ? Ask your doctor what activities are safe for  you.  Keep all follow-up visits as told by your doctor. This is important. Contact a doctor if:  You get a migraine headache that is different or worse than others you have had.  You have more than 15 headache days in one month. Get help right away if:  Your migraine headache gets very bad.  Your migraine headache lasts longer than 72 hours.  You have a fever.  You have a stiff neck.  You have trouble seeing.  Your muscles feel weak or like you cannot control them.  You start to lose your balance a lot.  You start to have trouble walking.  You pass out (faint).  You have a seizure. Summary  A migraine headache is a very strong throbbing pain on one side or both sides of your head. These headaches can also cause other symptoms.  This condition may be treated with medicines and changes to your lifestyle.  Keep a journal to find out what may bring on your migraine headaches.  Contact a doctor if you get a migraine headache that is different or worse than others you have had.  Contact your doctor if you have more than 15 headache days in a month. This information is not intended to replace advice given to you by your health care provider. Make sure you discuss any questions you have with your health care provider. Document Revised: 04/06/2019 Document Reviewed: 01/25/2019 Elsevier Patient Education  [place].

## 2020-11-24 NOTE — Progress Notes (Signed)
Patient ID: Michelle Neal, female    DOB: 12-31-1990, 29 y.o.   MRN: [id number]  PCP: Doren Custard, FNP  Chief Complaint  Patient presents with  . Follow-up    FMLA  . Migraine    Subjective:   Michelle Neal is a 29 y.o. female, presents to clinic with CC of the following:  Presents for [facility] paperwork, for dx of migraines PCP was Maurice Small, last OV Dec 2020, new to me  Hx of migraines, when pt established with [facility] last year (august 2020) she was having frequent HA's almost daily  Headaches: She is using Aimovig and is having a headache about every other to every 3 days now.  She is happy with this decrease as these were nearly daily prior to starting medication.  She has been very compliant with the Aimovig, no injection site reaction.  She has not tried the Imitrex yet - we will send in Rx again. If not working, will send in [place].  We will also add Elavil low dose today.  Typical Migraine:She notes the pain is usually unilateral (changes sides) and is thumping/throbbing in nature. Typically has to lay down to get rid of the headache; will sometimes get blurred vision, photosensitivity. She denies N/V, sound sensitivity, or aura.  - Stopped OCP for a period of time and this did not help - usingcondoms for contraception.  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 Mom with history of migraines.  Pt aimovid Rx from [facility] would have been out in October Elavil was started at last OV in Dec - would have been out in 6 months Imitrex abortive 25 mg rx'd   Current HPI Migraine  This is a chronic problem. Episode onset: teenager. The problem occurs daily. The pain is located in the right unilateral (usually unilaterally right temple, sometimes right occiput) region. The quality of the pain is described as throbbing (pounding). The pain is severe. Associated symptoms include blurred vision, phonophobia and  photophobia. Pertinent negatives include no abdominal pain, abnormal behavior, anorexia, back pain, coughing, drainage, ear pain, eye pain, facial sweating, muscle aches, nausea, neck pain, numbness, rhinorrhea, scalp tenderness, seizures, sinus pressure, sore throat, swollen glands, tingling, tinnitus, visual change, vomiting or weakness. Associated symptoms comments: Eyes sensitive, eyes twitch . The symptoms are aggravated by menstrual cycle, emotional stress and work. She has tried triptans (aimovig) for the symptoms. Her past medical history is significant for migraine headaches, migraines in the family and obesity. There is no history of cancer, hypertension, immunosuppression, pseudotumor cerebri, recent head traumas, sinus disease or TMJ.     Completing FMLA paperwork today Pt has not consulted with Neurology  Aneurysm great maternal aunt - new hx, no other family member, pt w/o past imaging    Patient Active Problem List   Diagnosis Date Noted  . Migraine without aura and without status migrainosus, not intractable 08/20/2019  . PROM (premature rupture of membranes) 07/08/2017  . NSVD (normal spontaneous vaginal delivery) 07/08/2017  . GBS (group B Streptococcus carrier), +RV culture, currently pregnant 06/20/2017  . LGSIL on Pap smear of cervix 01/06/2017  . Anemia in pregnancy 12/29/2016  . Normocytic anemia 11/03/2016  . Positive ANA (antinuclear antibody) 11/03/2016      Current Outpatient Medications:  .  amitriptyline (ELAVIL) 10 MG tablet, Take 1 tablet (10 mg total) by mouth at bedtime., Disp: 90 tablet, Rfl: 1 .  Erenumab-aooe (AIMOVIG) 70 MG/ML  SOAJ, Inject 70 mg into the skin every 30 (thirty) days., Disp: 3 pen, Rfl: 3 .  SUMAtriptan (IMITREX) 25 MG tablet, Take 1 tablet at onset of symptoms. May repeat in 2 hours if headache persists or recurs. Max dose is 2 tablets in 24 hours., Disp: 30 tablet, Rfl: 1   No Known Allergies   Social History   Tobacco Use  .  Smoking status: Never Smoker  . Smokeless tobacco: Never Used  Vaping Use  . Vaping Use: Never used  Substance Use Topics  . Alcohol use: Yes    Comment: Before pregnancy  . Drug use: No      Chart Review Today: I personally reviewed active problem list, medication list, allergies, family history, social history, health maintenance, notes from last encounter, lab results, imaging with the patient/caregiver today.   Review of Systems  Constitutional: Negative.   HENT: Negative.  Negative for ear pain, rhinorrhea, sinus pressure, sore throat and tinnitus.   Eyes: Positive for blurred vision and photophobia. Negative for pain.  Respiratory: Negative.  Negative for cough.   Cardiovascular: Negative.   Gastrointestinal: Negative.  Negative for abdominal pain, anorexia, nausea and vomiting.  Endocrine: Negative.   Genitourinary: Negative.   Musculoskeletal: Negative.  Negative for back pain and neck pain.  Skin: Negative.   Allergic/Immunologic: Negative.   Neurological: Negative.  Negative for tingling, seizures, weakness and numbness.  Hematological: Negative.   Psychiatric/Behavioral: Negative.   All other systems reviewed and are negative.      Objective:   Vitals:   11/24/20 0947  BP: 120/72  Pulse: 96  Resp: 16  Temp: 98.1 F (36.7 C)  TempSrc: Oral  SpO2: 99%  Weight: 205 lb 6.4 oz (93.2 kg)  Height: 5\' 4"  (1.626 m)    Body mass index is 35.26 kg/m.  Physical Exam Vitals and nursing note reviewed.  Constitutional:      General: She is not in acute distress.    Appearance: Normal appearance. She is well-developed. She is obese. She is not ill-appearing, toxic-appearing or diaphoretic.  HENT:     Head: Normocephalic and atraumatic.     Nose: Nose normal.  Eyes:     General:        Right eye: No discharge.        Left eye: No discharge.     Conjunctiva/sclera: Conjunctivae normal.  Neck:     Trachea: No tracheal deviation.  Cardiovascular:     Rate and  Rhythm: Normal rate and regular rhythm.     Pulses: Normal pulses.     Heart sounds: Normal heart sounds.  Pulmonary:     Effort: Pulmonary effort is normal. No respiratory distress.     Breath sounds: Normal breath sounds. No stridor.  Skin:    General: Skin is warm and dry.     Findings: No rash.  Neurological:     Mental Status: She is alert.     Motor: No abnormal muscle tone.     Coordination: Coordination normal.     Gait: Gait normal.  Psychiatric:        Mood and Affect: Mood normal.        Behavior: Behavior normal.      Results for orders placed or performed in visit on 08/20/19  COMPLETE METABOLIC PANEL WITH GFR  Result Value Ref Range   Glucose, Bld 81 65 - 99 mg/dL   BUN 14 7 - 25 mg/dL   Creat 08/22/19 6.64 - 4.03 mg/dL  GFR, Est Non African American 97 > OR = 60 mL/min/1.40m2   GFR, Est African American 112 > OR = 60 mL/min/1.63m2   BUN/Creatinine Ratio NOT APPLICABLE 6 - 22 (calc)   Sodium 137 135 - 146 mmol/L   Potassium 4.4 3.5 - 5.3 mmol/L   Chloride 106 98 - 110 mmol/L   CO2 23 20 - 32 mmol/L   Calcium 9.3 8.6 - 10.2 mg/dL   Total Protein 7.8 6.1 - 8.1 g/dL   Albumin 4.6 3.6 - 5.1 g/dL   Globulin 3.2 1.9 - 3.7 g/dL (calc)   AG Ratio 1.4 1.0 - 2.5 (calc)   Total Bilirubin 0.4 0.2 - 1.2 mg/dL   Alkaline phosphatase (APISO) 48 31 - 125 U/L   AST 29 10 - 30 U/L   ALT 56 (H) 6 - 29 U/L  Lipid panel  Result Value Ref Range   Cholesterol 158 <200 mg/dL   HDL 49 (L) > OR = 50 mg/dL   Triglycerides 54 <542 mg/dL   LDL Cholesterol (Calc) 95 mg/dL (calc)   Total CHOL/HDL Ratio 3.2 <5.0 (calc)   Non-HDL Cholesterol (Calc) 109 <130 mg/dL (calc)  TSH  Result Value Ref Range   TSH 1.09 mIU/L  Hemoglobin A1c  Result Value Ref Range   Hgb A1c MFr Bld 4.9 <5.7 % of total Hgb   Mean Plasma Glucose 94 (calc)   eAG (mmol/L) 5.2 (calc)  Insulin, random  Result Value Ref Range   Insulin 8.2 uIU/mL       Assessment & Plan:     ICD-10-CM   1. Encounter for  completion of form with patient  Z02.89    FMLA paperwork completed today   2. Migraine without aura and without status migrainosus, not intractable  G43.009 Erenumab-aooe (AIMOVIG) 70 MG/ML SOAJ   Meds refilled, nortriptyline and Imitrex not effective and had a lot of side effects, migraines frequent, refer to neuro/migraine specialists        Danelle Berry, PA-C 11/24/20 9:52 AM

## 2020-11-25 MED ORDER — AIMOVIG 70 MG/ML ~~LOC~~ SOAJ: 70.0000 mg | SUBCUTANEOUS | 3 refills | Status: DC

## 2020-12-02 ENCOUNTER — Encounter: Payer: Self-pay | Admitting: Family Medicine

## 2021-01-06 ENCOUNTER — Other Ambulatory Visit: Payer: Self-pay

## 2021-01-06 ENCOUNTER — Emergency Department
Admission: EM | Admit: 2021-01-06 | Discharge: 2021-01-06 | Disposition: A | Discharge status: Home / Self Care | Payer: No Typology Code available for payment source | Attending: Emergency Medicine | Admitting: Emergency Medicine

## 2021-01-06 DIAGNOSIS — Z20822 Contact with and (suspected) exposure to covid-19: Secondary | ICD-10-CM | POA: Diagnosis not present

## 2021-01-06 DIAGNOSIS — R509 Fever, unspecified: Secondary | ICD-10-CM | POA: Insufficient documentation

## 2021-01-06 DIAGNOSIS — R0981 Nasal congestion: Secondary | ICD-10-CM | POA: Diagnosis not present

## 2021-01-06 DIAGNOSIS — U071 COVID-19: Secondary | ICD-10-CM

## 2021-01-06 DIAGNOSIS — Z8669 Personal history of other diseases of the nervous system and sense organs: Secondary | ICD-10-CM | POA: Diagnosis not present

## 2021-01-06 DIAGNOSIS — R519 Headache, unspecified: Secondary | ICD-10-CM | POA: Diagnosis not present

## 2021-01-06 LAB — RESP PANEL BY RT-PCR (FLU A&B, COVID) ARPGX2
Influenza A by PCR: NEGATIVE
Influenza B by PCR: NEGATIVE
SARS Coronavirus 2 by RT PCR: NEGATIVE

## 2021-01-06 NOTE — ED Provider Notes (Signed)
[facility] Emergency Department Provider Note  ____________________________________________  Time seen: Approximately 4:13 PM  I have reviewed the triage vital signs and the nursing notes.   HISTORY  Chief Complaint No chief complaint on file.    HPI Michelle Neal is a 30 y.o. female who presents the emergency department with her daughters for COVID contact with symptoms.   Parent reports that she is having headache, fevers, nasal congestion.  She is also having body aches.  Symptoms have been ongoing x3 days.  The patient's father was COVID-positive and has been around them in the house and in the car without a mask until he realized that he was positive.  Patient and her daughters are experiencing similar symptoms and her concern for COVID.  They have attempted to be seen at urgent care and primary care but cannot be seen for testing.        Past Medical History:  Diagnosis Date  . Head ache   . Normal vaginal delivery 07/08/2018  . Normocytic anemia     Patient Active Problem List   Diagnosis Date Noted  . Migraine without aura and without status migrainosus, not intractable 08/20/2019  . PROM (premature rupture of membranes) 07/08/2017  . NSVD (normal spontaneous vaginal delivery) 07/08/2017  . GBS (group B Streptococcus carrier), +RV culture, currently pregnant 06/20/2017  . LGSIL on Pap smear of cervix 01/06/2017  . Anemia in pregnancy 12/29/2016  . Normocytic anemia 11/03/2016  . Positive ANA (antinuclear antibody) 11/03/2016    No past surgical history on file.  Prior to Admission medications   Medication Sig Start Date End Date Taking? Authorizing Provider  amitriptyline (ELAVIL) 10 MG tablet Take 1 tablet (10 mg total) by mouth at bedtime. 12/04/19   Doren Custard, FNP  Erenumab-aooe (AIMOVIG) 70 MG/ML SOAJ Inject 70 mg into the skin every 30 (thirty) days. 11/25/20   Danelle Berry, PA-C  SUMAtriptan (IMITREX) 25 MG tablet Take 1 tablet  at onset of symptoms. May repeat in 2 hours if headache persists or recurs. Max dose is 2 tablets in 24 hours. 12/04/19   Doren Custard, FNP    Allergies Patient has no known allergies.  Family History  Problem Relation Age of Onset  . Hypertension Mother   . Breast cancer Maternal Aunt        40-50'S AGE OF ONSET    Social History Social History   Tobacco Use  . Smoking status: Never Smoker  . Smokeless tobacco: Never Used  Vaping Use  . Vaping Use: Never used  Substance Use Topics  . Alcohol use: Yes    Comment: Before pregnancy  . Drug use: No     Review of Systems  Constitutional: Positive fever/chills.  Positive for body aches Eyes: No visual changes. No discharge ENT: Positive for nasal congestion Cardiovascular: no chest pain. Respiratory: no cough. No SOB. Gastrointestinal: No abdominal pain.  No nausea, no vomiting.  No diarrhea.  No constipation. Musculoskeletal: Negative for musculoskeletal pain. Skin: Negative for rash, abrasions, lacerations, ecchymosis. Neurological: Negative for headaches, focal weakness or numbness.  10 System ROS otherwise negative.  ____________________________________________   PHYSICAL EXAM:  VITAL SIGNS: ED Triage Vitals  Enc Vitals Group     BP 01/06/21 1459 109/75     Pulse Rate 01/06/21 1459 88     Resp 01/06/21 1459 18     Temp 01/06/21 1459 98.4 F (36.9 C)     Temp src --  SpO2 01/06/21 1459 100 %     Weight 01/06/21 1500 205 lb (93 kg)     Height 01/06/21 1500 5\' 4"  (1.626 m)     Head Circumference --      Peak Flow --      Pain Score 01/06/21 1500 5     Pain Loc --      Pain Edu? --      Excl. in [place]? --      Constitutional: Alert and oriented. Well appearing and in no acute distress. Eyes: Conjunctivae are normal. PERRL. EOMI. Head: Atraumatic. ENT:      Ears:       Nose: Mild congestion/rhinnorhea.      Mouth/Throat: Mucous membranes are moist.  Neck: No stridor.  Neck is supple full range of  motion Hematological/Lymphatic/Immunilogical: No cervical lymphadenopathy. Cardiovascular: Normal rate, regular rhythm. Normal S1 and S2.  Good peripheral circulation. Respiratory: Normal respiratory effort without tachypnea or retractions. Lungs CTAB. Good air entry to the bases with no decreased or absent breath sounds. Musculoskeletal: Full range of motion to all extremities. No gross deformities appreciated. Neurologic:  Normal speech and language. No gross focal neurologic deficits are appreciated.  Skin:  Skin is warm, dry and intact. No rash noted. Psychiatric: Mood and affect are normal. Speech and behavior are normal. Patient exhibits appropriate insight and judgement.   ____________________________________________   LABS (all labs ordered are listed, but only abnormal results are displayed)  Labs Reviewed  RESP PANEL BY RT-PCR (FLU A&B, COVID) ARPGX2   ____________________________________________  EKG   ____________________________________________  RADIOLOGY   No results found.  ____________________________________________    PROCEDURES  Procedure(s) performed:    Procedures    Medications - No data to display   ____________________________________________   INITIAL IMPRESSION / ASSESSMENT AND PLAN / ED COURSE  Pertinent labs & imaging results that were available during my care of the patient were reviewed by me and considered in my medical decision making (see chart for details).  Review of the [place] CSRS was performed in accordance of the [facility] prior to dispensing any controlled drugs.           Patient's diagnosis is consistent with COVID-19.  Patient presented to emergency department with multiple symptoms consistent with COVID.  She has been tested and results are pending.  Patient had close contact with her father who was in the house as well as traveling in the car without a mask with her and her 2 children who are also showing symptoms.  Again  test is not resulted but given the extreme close contact and multiple sick family members I do feel that this is likely COVID-19.  Tylenol, Motrin, fluids and rest at home.  Follow-up with primary care as needed.  No indication for further work-up currently. Patient is given ED precautions to return to the ED for any worsening or new symptoms.     ____________________________________________  FINAL CLINICAL IMPRESSION(S) / ED DIAGNOSES  Final diagnoses:  COVID-19      NEW MEDICATIONS STARTED DURING THIS VISIT:  ED Discharge Orders    None          This chart was dictated using voice recognition software/Dragon. Despite best efforts to proofread, errors can occur which can change the meaning. Any change was purely unintentional.    03/06/21, PA-C 01/06/21 1637    03/06/21, MD 01/06/21 1816

## 2021-01-06 NOTE — ED Notes (Signed)
Exposed to covid, having HA and fevers

## 2021-01-06 NOTE — ED Triage Notes (Signed)
First Nurse Note:  Arrives for COVID testing.  States her father has tested positive.  C/O headache and body aches since Saturday.    AAOx3.  Skin warm and dry. No SOB/ DOE.  NAD

## 2021-01-06 NOTE — ED Triage Notes (Signed)
Pt here for fever and headache. Pt states possible exposure.

## 2021-01-26 ENCOUNTER — Ambulatory Visit: Payer: No Typology Code available for payment source | Admitting: Family Medicine

## 2021-07-29 ENCOUNTER — Other Ambulatory Visit: Payer: Self-pay

## 2021-07-29 ENCOUNTER — Other Ambulatory Visit (HOSPITAL_COMMUNITY)
Admission: RE | Admit: 2021-07-29 | Discharge: 2021-07-29 | Disposition: A | Discharge status: Home / Self Care | Payer: No Typology Code available for payment source | Source: Ambulatory Visit | Attending: Family Medicine | Admitting: Family Medicine

## 2021-07-29 ENCOUNTER — Ambulatory Visit (INDEPENDENT_AMBULATORY_CARE_PROVIDER_SITE_OTHER): Payer: No Typology Code available for payment source | Admitting: Family Medicine

## 2021-07-29 ENCOUNTER — Encounter: Payer: Self-pay | Admitting: Family Medicine

## 2021-07-29 VITALS — BP 110/73 | HR 70 | Wt 180.0 lb

## 2021-07-29 DIAGNOSIS — Z3009 Encounter for other general counseling and advice on contraception: Secondary | ICD-10-CM

## 2021-07-29 DIAGNOSIS — Z124 Encounter for screening for malignant neoplasm of cervix: Secondary | ICD-10-CM | POA: Diagnosis present

## 2021-07-29 DIAGNOSIS — Z3041 Encounter for surveillance of contraceptive pills: Secondary | ICD-10-CM | POA: Diagnosis not present

## 2021-07-29 DIAGNOSIS — Z302 Encounter for sterilization: Secondary | ICD-10-CM | POA: Insufficient documentation

## 2021-07-29 MED ORDER — NORGESTREL-ETHINYL ESTRADIOL 0.3-30 MG-MCG PO TABS: 1.0000 | ORAL_TABLET | Freq: Every day | ORAL | 1 refills | Status: AC

## 2021-07-29 NOTE — Assessment & Plan Note (Signed)
Patient counseled, r.e. Risks benefits of BTL, including permanency of procedure, decreased risk of ovarian cancer.  Patient verbalized understanding and desires to proceed with salpingectomy. Risks include but are not limited to bleeding, infection, injury to surrounding structures, including bowel, bladder and ureters, blood clots, and death.  Likelihood of success is high.

## 2021-07-29 NOTE — Progress Notes (Signed)
   Subjective:    Patient ID: Michelle Neal is a 30 y.o. female presenting with No chief complaint on file.  on 07/29/2021  HPI: Here for BTL consult. Has no cycle on Lo-estrin and frequently taking pregnancy tests. Forgets pills. She has completed her family and would like to schedule a BTL. Also needs pap smear screening.  Review of Systems  Constitutional:  Negative for chills and fever.  Respiratory:  Negative for shortness of breath.   Cardiovascular:  Negative for chest pain.  Gastrointestinal:  Negative for abdominal pain, nausea and vomiting.  Genitourinary:  Negative for dysuria.  Skin:  Negative for rash.     Objective:    BP 110/73   Pulse 70   Wt 180 lb (81.6 kg)   BMI 30.90 kg/m  Physical Exam Constitutional:      General: She is not in acute distress.    Appearance: She is well-developed.  HENT:     Head: Normocephalic and atraumatic.  Eyes:     General: No scleral icterus. Cardiovascular:     Rate and Rhythm: Normal rate.  Pulmonary:     Effort: Pulmonary effort is normal.  Abdominal:     Palpations: Abdomen is soft.  Genitourinary:    Comments: BUS normal, vagina is pink and rugated, cervix is parous without lesion, uterus is small and anteverted, no adnexal mass or tenderness.  Musculoskeletal:     Cervical back: Neck supple.  Skin:    General: Skin is warm and dry.  Neurological:     Mental Status: She is alert and oriented to person, place, and time.        Assessment & Plan:   Problem List Items Addressed This Visit       Unprioritized   Sterilization consult    Patient counseled, r.e. Risks benefits of BTL, including permanency of procedure, decreased risk of ovarian cancer.  Patient verbalized understanding and desires to proceed with salpingectomy. Risks include but are not limited to bleeding, infection, injury to surrounding structures, including bowel, bladder and ureters, blood clots, and death.  Likelihood of success is high.         Other Visit Diagnoses     Screening for cervical cancer    -  Primary   Relevant Orders   Cytology - PAP   Oral contraceptive pill surveillance       Relevant Medications   norgestrel-ethinyl estradiol (LO/OVRAL) 0.3-30 MG-MCG tablet       Return in about 2 months (around 09/28/2021) for postop check.  Reva Bores 07/29/2021 12:18 PM

## 2021-07-30 LAB — CYTOLOGY - PAP
Chlamydia: NEGATIVE
Comment: NEGATIVE
Comment: NEGATIVE
Comment: NORMAL
Diagnosis: NEGATIVE
Neisseria Gonorrhea: NEGATIVE
Trichomonas: NEGATIVE

## 2021-08-11 ENCOUNTER — Telehealth: Payer: Self-pay | Admitting: *Deleted

## 2021-08-11 ENCOUNTER — Encounter: Payer: Self-pay | Admitting: *Deleted

## 2021-08-11 NOTE — Telephone Encounter (Signed)
Call to patient. Left message calling to review date options for surgery. Left message to call back.

## 2021-08-11 NOTE — Telephone Encounter (Signed)
Call to patient. Surgery date options reviewed and patient desires to proceed on 09-16-21. Will plan for 1pm and [facility] and arrive at 11am. Surgery instructions reviewed and advised will receive pre-op call and letter with hospital brochure.  Encounter closed.

## 2021-09-14 NOTE — Progress Notes (Signed)
Sent message via secure chat to [employer] rn pt has had 4 messages left and has not returned call.

## 2021-09-15 ENCOUNTER — Encounter (HOSPITAL_BASED_OUTPATIENT_CLINIC_OR_DEPARTMENT_OTHER): Payer: Self-pay | Admitting: Family Medicine

## 2021-09-15 ENCOUNTER — Other Ambulatory Visit: Payer: Self-pay

## 2021-09-15 NOTE — Progress Notes (Signed)
Spoke w/ via phone for pre-op interview---pt Lab needs dos---- cbc urine preg              Lab results------none COVID test -----patient states asymptomatic no test needed Arrive at -------1100 am 09-16-2021 NPO after MN NO Solid Food.  Clear liquids from MN until---1000 am Med rec completed Medications to take morning of surgery -----none Diabetic medication -----n/a Patient instructed no nail polish to be worn day of surgery Patient instructed to bring photo id and insurance card day of surgery Patient aware to have Driver (ride ) / caregiver sister or fiance will arrange   for 24 hours after surgery  Patient Special Instructions -----none Pre-Op special Istructions -----none Patient verbalized understanding of instructions that were given at this phone interview. Patient denies shortness of breath, chest pain, fever, cough at this phone interview.

## 2021-09-15 NOTE — H&P (Signed)
Michelle Neal is an 30 y.o. [phone] female.   Chief Complaint: undesired fertility HPI: Here for tubal sterilization.  Past Medical History:  Diagnosis Date   Head ache    Normal vaginal delivery 07/08/2018   Normocytic anemia     No past surgical history on file.  Family History  Problem Relation Age of Onset   Hypertension Mother    Breast cancer Maternal Aunt        40-50'S AGE OF ONSET   Social History:  reports that she has never smoked. She has never used smokeless tobacco. She reports current alcohol use. She reports that she does not use drugs.  Allergies: No Known Allergies  No medications prior to admission.    A comprehensive review of systems was negative.  There were no vitals taken for this visit. General appearance: alert, cooperative, and appears stated age Head: Normocephalic, without obvious abnormality, atraumatic Lungs:  normal effort Heart: regular rate and rhythm Abdomen: soft, non-tender; bowel sounds normal; no masses,  no organomegaly Extremities: extremities normal, atraumatic, no cyanosis or edema Skin: Skin color, texture, turgor normal. No rashes or lesions Neurologic: Grossly normal   Lab Results  Component Value Date   WBC 11.1 (H) 07/07/2017   HGB 10.2 (L) 07/07/2017   HCT 30.8 (L) 07/07/2017   MCV 94.2 07/07/2017   PLT 124 (L) 07/07/2017   Lab Results  Component Value Date   PREGTESTUR Positive (A) 08/18/2016     Assessment/Plan Principal Problem:   Encounter for sterilization  For laparoscopic bilateral salpingectomy, due to permanency and decreased risk of ovarian cancer. Risks include but are not limited to bleeding, infection, injury to surrounding structures, including bowel, bladder and ureters, blood clots, and death.  Likelihood of success is highg.    Reva Bores 09/15/2021, 7:14 AM

## 2021-09-16 ENCOUNTER — Encounter (HOSPITAL_BASED_OUTPATIENT_CLINIC_OR_DEPARTMENT_OTHER): Payer: Self-pay | Admitting: Family Medicine

## 2021-09-16 ENCOUNTER — Encounter (HOSPITAL_BASED_OUTPATIENT_CLINIC_OR_DEPARTMENT_OTHER): Payer: Self-pay | Admitting: Anesthesiology

## 2021-09-16 ENCOUNTER — Ambulatory Visit (HOSPITAL_BASED_OUTPATIENT_CLINIC_OR_DEPARTMENT_OTHER)
Admission: RE | Admit: 2021-09-16 | Discharge: 2021-09-16 | Disposition: A | Discharge status: Home / Self Care | Payer: No Typology Code available for payment source | Attending: Family Medicine | Admitting: Family Medicine

## 2021-09-16 ENCOUNTER — Encounter (HOSPITAL_BASED_OUTPATIENT_CLINIC_OR_DEPARTMENT_OTHER)
Admission: RE | Disposition: A | Discharge status: Home / Self Care | Payer: Self-pay | Source: Home / Self Care | Attending: Family Medicine

## 2021-09-16 DIAGNOSIS — Z539 Procedure and treatment not carried out, unspecified reason: Secondary | ICD-10-CM | POA: Diagnosis not present

## 2021-09-16 DIAGNOSIS — Z302 Encounter for sterilization: Secondary | ICD-10-CM

## 2021-09-16 HISTORY — DX: Headache, unspecified: R51.9

## 2021-09-16 LAB — HCG, QUANTITATIVE, PREGNANCY: hCG, Beta Chain, Quant, S: 19 m[IU]/mL — ABNORMAL HIGH (ref <5)

## 2021-09-16 LAB — CBC
HCT: 36.8 % (ref 36.0–46.0)
Hemoglobin: 12.2 g/dL (ref 12.0–15.0)
MCH: 31.1 pg (ref 26.0–34.0)
MCHC: 33.2 g/dL (ref 30.0–36.0)
MCV: 93.9 fL (ref 80.0–100.0)
Platelets: 140 10*3/uL — ABNORMAL LOW (ref 150–400)
RBC: 3.92 MIL/uL (ref 3.87–5.11)
RDW: 11.7 % (ref 11.5–15.5)
WBC: 3.9 10*3/uL — ABNORMAL LOW (ref 4.0–10.5)
nRBC: 0 % (ref 0.0–0.2)

## 2021-09-16 LAB — POCT PREGNANCY, URINE: Preg Test, Ur: POSITIVE — AB

## 2021-09-16 SURGERY — SALPINGECTOMY, BILATERAL, LAPAROSCOPIC
Anesthesia: General | Laterality: Bilateral

## 2021-09-16 MED ORDER — ONDANSETRON HCL 4 MG/2ML IJ SOLN
INTRAMUSCULAR | Status: AC
  Filled 2021-09-16: qty 2

## 2021-09-16 MED ORDER — DEXAMETHASONE SODIUM PHOSPHATE 10 MG/ML IJ SOLN
INTRAMUSCULAR | Status: AC
  Filled 2021-09-16: qty 1

## 2021-09-16 MED ORDER — POVIDONE-IODINE 10 % EX SWAB: 2.0000 "application " | Freq: Once | CUTANEOUS | Status: DC

## 2021-09-16 MED ORDER — KETOROLAC TROMETHAMINE 30 MG/ML IJ SOLN
INTRAMUSCULAR | Status: AC
  Filled 2021-09-16: qty 1

## 2021-09-16 MED ORDER — SCOPOLAMINE 1 MG/3DAYS TD PT72: 1.0000 | MEDICATED_PATCH | TRANSDERMAL | Status: DC

## 2021-09-16 MED ORDER — LIDOCAINE HCL (PF) 2 % IJ SOLN
INTRAMUSCULAR | Status: AC
  Filled 2021-09-16: qty 5

## 2021-09-16 MED ORDER — ACETAMINOPHEN 500 MG PO TABS: 1000.0000 mg | ORAL_TABLET | ORAL | Status: DC

## 2021-09-16 MED ORDER — FENTANYL CITRATE (PF) 100 MCG/2ML IJ SOLN
INTRAMUSCULAR | Status: AC
  Filled 2021-09-16: qty 2

## 2021-09-16 MED ORDER — MIDAZOLAM HCL 2 MG/2ML IJ SOLN
INTRAMUSCULAR | Status: AC
  Filled 2021-09-16: qty 2

## 2021-09-16 MED ORDER — PROPOFOL 10 MG/ML IV BOLUS
INTRAVENOUS | Status: AC
  Filled 2021-09-16: qty 20

## 2021-09-16 MED ORDER — LACTATED RINGERS IV SOLN: INTRAVENOUS | Status: DC

## 2021-09-16 MED ORDER — GABAPENTIN 300 MG PO CAPS: 300.0000 mg | ORAL_CAPSULE | ORAL | Status: DC

## 2021-09-16 SURGICAL SUPPLY — 32 items
BLADE SURG 15 STRL LF DISP TIS (BLADE) ×1 IMPLANT
BLADE SURG 15 STRL SS (BLADE) ×1
CABLE HIGH FREQUENCY MONO STRZ (ELECTRODE) IMPLANT
CATH ROBINSON RED A/P 16FR (CATHETERS) IMPLANT
COVER MAYO STAND STRL (DRAPES) ×2 IMPLANT
DRSG OPSITE POSTOP 3X4 (GAUZE/BANDAGES/DRESSINGS) IMPLANT
DURAPREP 26ML APPLICATOR (WOUND CARE) ×2 IMPLANT
GAUZE 4X4 16PLY ~~LOC~~+RFID DBL (SPONGE) ×2 IMPLANT
GLOVE SURG LTX SZ7 (GLOVE) ×2 IMPLANT
GLOVE SURG UNDER POLY LF SZ7 (GLOVE) ×8 IMPLANT
GOWN STRL REUS W/TWL LRG LVL3 (GOWN DISPOSABLE) ×4 IMPLANT
KIT TURNOVER CYSTO (KITS) ×2 IMPLANT
NS IRRIG 1000ML POUR BTL (IV SOLUTION) ×2 IMPLANT
PACK LAPAROSCOPY BASIN (CUSTOM PROCEDURE TRAY) ×2 IMPLANT
PACK TRENDGUARD 450 HYBRID PRO (MISCELLANEOUS) IMPLANT
PACK TRENDGUARD 600 HYBRD PROC (MISCELLANEOUS) IMPLANT
PAD OB MATERNITY 4.3X12.25 (PERSONAL CARE ITEMS) ×2 IMPLANT
PAD PREP 24X48 CUFFED NSTRL (MISCELLANEOUS) ×2 IMPLANT
POUCH SPECIMEN RETRIEVAL 10MM (ENDOMECHANICALS) IMPLANT
SET SUCTION IRRIG HYDROSURG (IRRIGATION / IRRIGATOR) IMPLANT
SET TUBE SMOKE EVAC HIGH FLOW (TUBING) ×2 IMPLANT
SHEARS HARMONIC ACE PLUS 36CM (ENDOMECHANICALS) IMPLANT
SLEEVE SCD COMPRESS KNEE MED (STOCKING) ×2 IMPLANT
SUT VIC AB 3-0 X1 27 (SUTURE) ×2 IMPLANT
SUT VICRYL 0 UR6 27IN ABS (SUTURE) ×2 IMPLANT
TOWEL OR 17X26 10 PK STRL BLUE (TOWEL DISPOSABLE) ×2 IMPLANT
TRAY FOLEY W/BAG SLVR 14FR LF (SET/KITS/TRAYS/PACK) IMPLANT
TRENDGUARD 450 HYBRID PRO PACK (MISCELLANEOUS)
TRENDGUARD 600 HYBRID PROC PK (MISCELLANEOUS)
TROCAR BALLN 12MMX100 BLUNT (TROCAR) ×2 IMPLANT
TROCAR BLADELESS OPT 5 100 (ENDOMECHANICALS) ×4 IMPLANT
WARMER LAPAROSCOPE (MISCELLANEOUS) ×2 IMPLANT

## 2021-09-16 NOTE — Anesthesia Preprocedure Evaluation (Deleted)
Anesthesia Evaluation  Patient identified by MRN, date of birth, ID band Patient awake    Reviewed: Allergy & Precautions, NPO status , Patient's Chart, lab work & pertinent test results  Airway Mallampati: II  TM Distance: >3 FB Neck ROM: Full    Dental  (+) Dental Advisory Given   Pulmonary neg pulmonary ROS,    breath sounds clear to auscultation       Cardiovascular negative cardio ROS   Rhythm:Regular Rate:Normal     Neuro/Psych  Headaches,    GI/Hepatic negative GI ROS, Neg liver ROS,   Endo/Other  negative endocrine ROS  Renal/GU negative Renal ROS     Musculoskeletal   Abdominal   Peds  Hematology negative hematology ROS (+)   Anesthesia Other Findings   Reproductive/Obstetrics                             Anesthesia Physical Anesthesia Plan  ASA: 1  Anesthesia Plan: General   Post-op Pain Management:    Induction: Intravenous  PONV Risk Score and Plan: 4 or greater and Ondansetron, Dexamethasone, Midazolam and Scopolamine patch - Pre-op  Airway Management Planned: Oral ETT  Additional Equipment: None  Intra-op Plan:   Post-operative Plan: Extubation in OR  Informed Consent: I have reviewed the patients History and Physical, chart, labs and discussed the procedure including the risks, benefits and alternatives for the proposed anesthesia with the patient or authorized representative who has indicated his/her understanding and acceptance.     Dental advisory given  Plan Discussed with: CRNA  Anesthesia Plan Comments:         Anesthesia Quick Evaluation

## 2021-11-27 ENCOUNTER — Encounter: Payer: Self-pay | Admitting: Family Medicine

## 2022-01-06 ENCOUNTER — Ambulatory Visit: Payer: No Typology Code available for payment source | Admitting: Family Medicine

## 2022-07-23 ENCOUNTER — Ambulatory Visit: Payer: No Typology Code available for payment source | Admitting: Family Medicine

## 2022-09-28 ENCOUNTER — Ambulatory Visit: Payer: No Typology Code available for payment source | Admitting: Family Medicine

## 2023-02-20 ENCOUNTER — Emergency Department
Admission: EM | Admit: 2023-02-20 | Discharge: 2023-02-20 | Disposition: A | Discharge status: Home / Self Care | Payer: No Typology Code available for payment source | Attending: Emergency Medicine | Admitting: Emergency Medicine

## 2023-02-20 ENCOUNTER — Other Ambulatory Visit: Payer: Self-pay

## 2023-02-20 DIAGNOSIS — R519 Headache, unspecified: Secondary | ICD-10-CM | POA: Diagnosis present

## 2023-02-20 DIAGNOSIS — Z1152 Encounter for screening for COVID-19: Secondary | ICD-10-CM | POA: Diagnosis not present

## 2023-02-20 DIAGNOSIS — J020 Streptococcal pharyngitis: Secondary | ICD-10-CM | POA: Insufficient documentation

## 2023-02-20 LAB — RESP PANEL BY RT-PCR (RSV, FLU A&B, COVID)  RVPGX2
Influenza A by PCR: NEGATIVE
Influenza B by PCR: NEGATIVE
Resp Syncytial Virus by PCR: NEGATIVE
SARS Coronavirus 2 by RT PCR: NEGATIVE

## 2023-02-20 LAB — GROUP A STREP BY PCR: Group A Strep by PCR: DETECTED — AB

## 2023-02-20 MED ORDER — PENICILLIN V POTASSIUM 500 MG PO TABS: 500.0000 mg | ORAL_TABLET | Freq: Two times a day (BID) | ORAL | 0 refills | Status: AC

## 2023-02-20 MED ORDER — METOCLOPRAMIDE HCL 10 MG PO TABS: 10.0000 mg | ORAL_TABLET | Freq: Three times a day (TID) | ORAL | 0 refills | Status: AC | PRN

## 2023-02-20 MED ORDER — PENICILLIN V POTASSIUM 500 MG PO TABS
500.0000 mg | ORAL_TABLET | Freq: Once | ORAL | Status: AC
  Administered 2023-02-20: 500 mg via ORAL
  Filled 2023-02-20: qty 1

## 2023-02-20 MED ORDER — KETOROLAC TROMETHAMINE 15 MG/ML IJ SOLN
15.0000 mg | Freq: Once | INTRAMUSCULAR | Status: AC
  Administered 2023-02-20: 15 mg via INTRAVENOUS
  Filled 2023-02-20: qty 1

## 2023-02-20 MED ORDER — IBUPROFEN 600 MG PO TABS: 600.0000 mg | ORAL_TABLET | Freq: Four times a day (QID) | ORAL | 0 refills | Status: DC | PRN

## 2023-02-20 MED ORDER — SODIUM CHLORIDE 0.9 % IV BOLUS
1000.0000 mL | Freq: Once | INTRAVENOUS | Status: AC
  Administered 2023-02-20: 1000 mL via INTRAVENOUS

## 2023-02-20 MED ORDER — METOCLOPRAMIDE HCL 5 MG/ML IJ SOLN
10.0000 mg | Freq: Once | INTRAMUSCULAR | Status: AC
  Administered 2023-02-20: 10 mg via INTRAVENOUS
  Filled 2023-02-20: qty 2

## 2023-02-20 NOTE — Discharge Instructions (Addendum)
Take the ibuprofen and Reglan as needed for headache.  Take the penicillin as prescribed and finish the full 10-day course even if your symptoms have resolved.  Return to the ER for new, worsening, or persistent severe throat pain, headache, neck stiffness, high fever, vomiting, weakness, or any other new or worsening symptoms that concern you.

## 2023-02-20 NOTE — ED Triage Notes (Signed)
Patient presents with 9/10 headache that began yesterday; No relief with Motrin or Goodie powders; Patient's mother states that she gets headaches 3-4 times per week; Patient also reports sore throat and post nasal drip; Tachycardic in triage with HR 124

## 2023-02-20 NOTE — ED Provider Notes (Signed)
[facility] Provider Note    Event Date/Time   First MD Initiated Contact with Patient 02/20/23 1126     (approximate)   History     HPI  Michelle Neal is a 32 y.o. female with history of migraines and anemia who presents with headache since yesterday, bilateral and frontal in location, somewhat throbbing, and not relieved by Motrin or BC powders at home.  The patient states that she also has a sore throat and postnasal drip since yesterday.  She has pain on swallowing but is able to swallow.  She denies any fever or chills at home and has no vomiting or diarrhea.  The patient states that the headache feels somewhat different to her chronic headaches although the mother states that the patient has had similar headaches previously.  I reviewed the past medical records.  The patient's most recent hospital encounter was on 09/15/2021 for tubal sterilization.  She has no recent ED visits or admissions.   Physical Exam   Triage Vital Signs: ED Triage Vitals  Enc Vitals Group     BP 02/20/23 1115 116/79     Pulse Rate 02/20/23 1115 (!) 124     Resp 02/20/23 1115 16     Temp 02/20/23 1115 99.9 F (37.7 C)     Temp Source 02/20/23 1115 Oral     SpO2 02/20/23 1115 99 %     Weight 02/20/23 1107 155 lb (70.3 kg)     Height 02/20/23 1107 '5\' 4"'$  (1.626 m)     Head Circumference --      Peak Flow --      Pain Score 02/20/23 1106 9     Pain Loc --      Pain Edu? --      Excl. in [place]? --     Most recent vital signs: Vitals:   02/20/23 1408 02/20/23 1415  BP:  94/65  Pulse: (P) 99 99  Resp: (P) 18 18  Temp:    SpO2: (P) 100% 100%     General: Awake, no distress.  CV:  Good peripheral perfusion.  Resp:  Normal effort.  Lungs CTAB. Abd:  No distention.  Other:  Oropharynx with slight erythema, no exudates.  Minimally swollen tonsils.  No cervical lymphadenopathy.  EOMI.  PERRLA.  No photophobia.  Neck supple, full ROM.  Negative Kernig and Brudzinski's  signs.   ED Results / Procedures / Treatments   Labs (all labs ordered are listed, but only abnormal results are displayed) Labs Reviewed  GROUP A STREP BY PCR - Abnormal; Notable for the following components:      Result Value   Group A Strep by PCR DETECTED (*)    All other components within normal limits  RESP PANEL BY RT-PCR (RSV, FLU A&B, COVID)  [id number]     EKG    RADIOLOGY    PROCEDURES:  Critical Care performed: No  Procedures   MEDICATIONS ORDERED IN ED: Medications  sodium chloride 0.9 % bolus 1,000 mL (0 mLs Intravenous Stopped 02/20/23 1406)  ketorolac (TORADOL) 15 MG/ML injection 15 mg (15 mg Intravenous Given 02/20/23 1218)  metoCLOPramide (REGLAN) injection 10 mg (10 mg Intravenous Given 02/20/23 1218)  penicillin v potassium (VEETID) tablet 500 mg (500 mg Oral Given 02/20/23 1406)     IMPRESSION / MDM / ASSESSMENT AND PLAN / ED COURSE  I reviewed the triage vital signs and the nursing notes.  32 year old female with PMH as noted above presents with  headache, sore throat, postnasal drip.  On exam she has a low-grade temperature and tachycardia with otherwise normal vital signs.  Physical exam is otherwise reassuring.  There is erythema in the throat but no significant exudates.  There are no meningeal signs.  Differential diagnosis includes, but is not limited to, strep pharyngitis, viral pharyngitis, other viral URI.  There is no clinical evidence for meningitis.  Will obtain respiratory panel and strep swab.  Patient's presentation is most consistent with acute complicated illness / injury requiring diagnostic workup.  ----------------------------------------- 1:56 PM on 02/20/2023 -----------------------------------------  Strep swab is positive.  Respiratory panel is negative.  The headache is significantly improved.  Her heart rate is now normal.  The patient is stable for discharge home.  She elected for p.o. potassium prescription rather than IM.   I counseled her on the results of the workup and plan of care.  I gave strict return precautions and the patient expressed understanding.   FINAL CLINICAL IMPRESSION(S) / ED DIAGNOSES   Final diagnoses:  Strep pharyngitis  Acute nonintractable headache, unspecified headache type     Rx / DC Orders   ED Discharge Orders          Ordered    penicillin v potassium (VEETID) 500 MG tablet  2 times daily        02/20/23 1355    metoCLOPramide (REGLAN) 10 MG tablet  Every 8 hours PRN        02/20/23 1355    ibuprofen (ADVIL) 600 MG tablet  Every 6 hours PRN        02/20/23 1355             Note:  This document was prepared using Dragon voice recognition software and may include unintentional dictation errors.    Arta Silence, MD 02/20/23 [phone]

## 2023-04-28 ENCOUNTER — Ambulatory Visit: Payer: No Typology Code available for payment source | Admitting: Physician Assistant

## 2023-05-09 ENCOUNTER — Ambulatory Visit: Payer: No Typology Code available for payment source | Admitting: Physician Assistant

## 2023-06-20 ENCOUNTER — Ambulatory Visit: Payer: No Typology Code available for payment source | Admitting: Internal Medicine

## 2023-06-20 ENCOUNTER — Telehealth: Payer: Self-pay | Admitting: Internal Medicine

## 2023-06-20 NOTE — Telephone Encounter (Signed)
6.24.24 New pt no show / no letter sent but block for future scheduling

## 2023-08-04 ENCOUNTER — Encounter: Payer: Self-pay | Admitting: Family

## 2023-08-04 NOTE — Progress Notes (Signed)
  This encounter was created in error - please disregard. No show 

## 2023-09-24 ENCOUNTER — Encounter (HOSPITAL_COMMUNITY): Payer: Self-pay

## 2023-09-24 ENCOUNTER — Ambulatory Visit (INDEPENDENT_AMBULATORY_CARE_PROVIDER_SITE_OTHER): Payer: No Typology Code available for payment source

## 2023-09-24 ENCOUNTER — Ambulatory Visit (HOSPITAL_COMMUNITY)
Admission: EM | Admit: 2023-09-24 | Discharge: 2023-09-24 | Disposition: A | Discharge status: Home / Self Care | Payer: No Typology Code available for payment source | Attending: Emergency Medicine | Admitting: Emergency Medicine

## 2023-09-24 DIAGNOSIS — S62642A Nondisplaced fracture of proximal phalanx of right middle finger, initial encounter for closed fracture: Secondary | ICD-10-CM

## 2023-09-24 DIAGNOSIS — S62640A Nondisplaced fracture of proximal phalanx of right index finger, initial encounter for closed fracture: Secondary | ICD-10-CM

## 2023-09-24 MED ORDER — IBUPROFEN 800 MG PO TABS
ORAL_TABLET | ORAL | Status: AC
  Filled 2023-09-24: qty 1

## 2023-09-24 MED ORDER — IBUPROFEN 800 MG PO TABS
800.0000 mg | ORAL_TABLET | Freq: Once | ORAL | Status: AC
  Administered 2023-09-24: 800 mg via ORAL

## 2023-09-24 MED ORDER — IBUPROFEN 800 MG PO TABS: 800.0000 mg | ORAL_TABLET | Freq: Three times a day (TID) | ORAL | 0 refills | Status: AC

## 2023-09-24 NOTE — Discharge Instructions (Signed)
You have fractures of your right index and right middle finger.  We have buddy taped them together to allow for proper healing.  You can take 800 mg of ibuprofen 3 times daily, if you are having breakthrough pain you can take 500 mg of Tylenol.  Please keep them elevated, refrain from using them, you can also ice them for 20 minutes at a time to help with swelling.  Follow-up with an orthopedic within the next week to ensure proper healing.  Return to clinic for any new or urgent symptoms.

## 2023-09-24 NOTE — ED Triage Notes (Signed)
injury to right hand 2 days. Patient states she was pulling something jammed in the vacuum cleaner and her hand hit the wall. Onset Thursday night. The hand is swollen and unable to make a fist.

## 2023-09-24 NOTE — ED Notes (Signed)
Ortho tech made aware that splint order was cancelled.

## 2023-09-24 NOTE — ED Provider Notes (Signed)
[facility]    CSN: [id number] Arrival date & time: 09/24/23  1228      History   Chief Complaint Chief Complaint  Patient presents with   Hand Injury    HPI Michelle Neal is a 32 y.o. female.   Patient presents to clinic for right index and right middle finger pain after having an injury to her hand 2 days prior.  She was pulling a string of carpet out of her vacuum cleaner when her hand hit hard against the wall.  Her hand is swollen and it hurts to make a fist.  Did not break skin.  She has been taking Tylenol, which has been helping to manage her pain.  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  The history is provided by the patient and medical records.  Hand Injury   Past Medical History:  Diagnosis Date   COVID 2020   loss of taste and smell severe headache body ache chills x 10 days all symptosm resolved   migraine    no current meds   Normal vaginal delivery    2011 and 2018   Normocytic anemia     Patient Active Problem List   Diagnosis Date Noted   Encounter for sterilization 07/29/2021   Migraine without aura and without status migrainosus, not intractable 08/20/2019   LGSIL on Pap smear of cervix 01/06/2017   Normocytic anemia 11/03/2016   Positive ANA (antinuclear antibody) 11/03/2016    Past Surgical History:  Procedure Laterality Date   NO PAST SURGERIES      OB History     Gravida  3   Para  2   Term  2   Preterm  0   AB  1   Living  2      SAB  0   IAB  1   Ectopic  0   Multiple  0   Live Births  2            Home Medications    Prior to Admission medications   Medication Sig Start Date End Date Taking? Authorizing Provider  ibuprofen (ADVIL) 800 MG tablet Take 1 tablet (800 mg total) by mouth 3 (three) times daily. 09/24/23  Yes Rinaldo Ratel, [place], FNP  norgestrel-ethinyl estradiol (LO/OVRAL) 0.3-30 MG-MCG tablet Take 1 tablet by mouth daily. 07/29/21  Yes Reva Bores, MD   metoCLOPramide (REGLAN) 10 MG tablet Take 1 tablet (10 mg total) by mouth every 8 (eight) hours as needed for nausea (headache). 02/20/23 02/20/24  Dionne Bucy, MD    Family History Family History  Problem Relation Age of Onset   Hypertension Mother    Breast cancer Maternal Aunt        40-50'S AGE OF ONSET    Social History Social History   Tobacco Use   Smoking status: Never   Smokeless tobacco: Never  Vaping Use   Vaping status: Never Used  Substance Use Topics   Alcohol use: Yes    Comment: occ   Drug use: No     Allergies   Patient has no known allergies.   Review of Systems Review of Systems  Reason unable to perform ROS: per HPI.  Musculoskeletal:  Positive for joint swelling.  Skin:  Negative for wound.     Physical Exam Triage Vital Signs ED Triage Vitals  Encounter Vitals Group     BP 09/24/23 1253 109/75     Systolic BP Percentile --  Diastolic BP Percentile --      Pulse Rate 09/24/23 1253 80     Resp 09/24/23 1253 16     Temp 09/24/23 1253 98.5 F (36.9 C)     Temp Source 09/24/23 1253 Oral     SpO2 09/24/23 1253 99 %     Weight 09/24/23 1253 160 lb (72.6 kg)     Height 09/24/23 1253 5\' 4"  (1.626 m)     Head Circumference --      Peak Flow --      Pain Score 09/24/23 1251 6     Pain Loc --      Pain Education --      Exclude from Growth Chart --    No data found.  Updated Vital Signs BP 109/75 (BP Location: Right Arm)   Pulse 80   Temp 98.5 F (36.9 C) (Oral)   Resp 16   Ht 5\' 4"  (1.626 m)   Wt 160 lb (72.6 kg)   LMP 08/28/2023 (Approximate)   SpO2 99%   BMI 27.46 kg/m   Visual Acuity Right Eye Distance:   Left Eye Distance:   Bilateral Distance:    Right Eye Near:   Left Eye Near:    Bilateral Near:     Physical Exam Vitals and nursing note reviewed.  Constitutional:      Appearance: Normal appearance.  HENT:     Head: Normocephalic and atraumatic.     Right Ear: External ear normal.     Left Ear:  External ear normal.     Nose: Nose normal.     Mouth/Throat:     Mouth: Mucous membranes are moist.  Eyes:     Conjunctiva/sclera: Conjunctivae normal.  Cardiovascular:     Rate and Rhythm: Normal rate.     Pulses: Normal pulses.  Pulmonary:     Effort: Pulmonary effort is normal. No respiratory distress.  Musculoskeletal:        General: Swelling, tenderness and signs of injury present.     Right hand: Swelling, tenderness and bony tenderness present. Decreased range of motion. Normal strength. Normal sensation. There is no disruption of two-point discrimination. Normal capillary refill. Normal pulse.     Comments: Swelling and tenderness to palpation over proximal right index and middle fingers.   Skin:    General: Skin is warm and dry.     Capillary Refill: Capillary refill takes less than 2 seconds.  Neurological:     General: No focal deficit present.     Mental Status: She is alert and oriented to person, place, and time.  Psychiatric:        Mood and Affect: Mood normal.        Behavior: Behavior normal. Behavior is cooperative.      UC Treatments / Results  Labs (all labs ordered are listed, but only abnormal results are displayed) Labs Reviewed - No data to display  EKG   Radiology DG Hand Complete Right  Result Date: 09/24/2023 CLINICAL DATA:  Blunt trauma.  Vacuum cleaner injury EXAM: RIGHT HAND - COMPLETE 3+ VIEW COMPARISON:  None Available. FINDINGS: There is no evidence of fracture or dislocation. There is no evidence of arthropathy or other focal bone abnormality. Soft tissues are unremarkable. IMPRESSION: No fracture or dislocation. Electronically Signed   By: Genevive Bi M.D.   On: 09/24/2023 13:41    Procedures Procedures (including critical care time)  Medications Ordered in UC Medications  ibuprofen (ADVIL) tablet 800 mg (800 mg  Oral Given 09/24/23 1324)    Initial Impression / Assessment and Plan / UC Course  I have reviewed the triage vital  signs and the nursing notes.  Pertinent labs & imaging results that were available during my care of the patient were reviewed by me and considered in my medical decision making (see chart for details).  Vitals and triage reviewed, patient is hemodynamically stable.  Right proximal second and third metacarpal tenderness to palpation with swelling, non-displaced fractures to both proximal phalanx on imaging, awaiting official radiology read. Buddy taping applied. NSAIDS for pain and ortho f/u as needed.  Plan of care, follow-up care and return precautions given, no questions at this time.    Final Clinical Impressions(s) / UC Diagnoses   Final diagnoses:  Closed nondisplaced fracture of proximal phalanx of right index finger, initial encounter  Closed nondisplaced fracture of proximal phalanx of right middle finger, initial encounter     Discharge Instructions      You have fractures of your right index and right middle finger.  We have buddy taped them together to allow for proper healing.  You can take 800 mg of ibuprofen 3 times daily, if you are having breakthrough pain you can take 500 mg of Tylenol.  Please keep them elevated, refrain from using them, you can also ice them for 20 minutes at a time to help with swelling.  Follow-up with an orthopedic within the next week to ensure proper healing.  Return to clinic for any new or urgent symptoms.     ED Prescriptions     Medication Sig Dispense Auth. Provider   ibuprofen (ADVIL) 800 MG tablet Take 1 tablet (800 mg total) by mouth 3 (three) times daily. 21 tablet Else Habermann, [place] N, [place]      PDMP not reviewed this encounter.   Marquisa Salih, [place] N, [place] 09/24/23 1345

## 2023-09-24 NOTE — ED Notes (Signed)
Ortho tech made aware of splint needed
